# Patient Record
Sex: Female | Born: 1960 | Race: White | Hispanic: No | Marital: Married | Smoking: Never smoker
Health system: Southern US, Community
[De-identification: ages and names within clinical notes are randomized; demographics above are authoritative.]

## PROBLEM LIST (undated history)

## (undated) DIAGNOSIS — R0600 Dyspnea, unspecified: Secondary | ICD-10-CM

## (undated) DIAGNOSIS — M1990 Unspecified osteoarthritis, unspecified site: Secondary | ICD-10-CM

## (undated) DIAGNOSIS — R011 Cardiac murmur, unspecified: Secondary | ICD-10-CM

## (undated) DIAGNOSIS — K219 Gastro-esophageal reflux disease without esophagitis: Secondary | ICD-10-CM

## (undated) DIAGNOSIS — J45909 Unspecified asthma, uncomplicated: Secondary | ICD-10-CM

## (undated) HISTORY — PX: CERVICAL SPINE SURGERY: SHX589

## (undated) HISTORY — PX: ABDOMINAL HYSTERECTOMY: SHX81

---

## 1997-11-01 ENCOUNTER — Other Ambulatory Visit: Admission: RE | Admit: 1997-11-01 | Discharge: 1997-11-01 | Payer: Self-pay | Admitting: Gynecology

## 1998-04-19 ENCOUNTER — Ambulatory Visit (HOSPITAL_COMMUNITY): Admission: RE | Admit: 1998-04-19 | Discharge: 1998-04-19 | Payer: Self-pay | Admitting: Neurosurgery

## 1998-04-19 ENCOUNTER — Encounter: Payer: Self-pay | Admitting: Neurosurgery

## 1998-06-27 ENCOUNTER — Ambulatory Visit (HOSPITAL_COMMUNITY): Admission: RE | Admit: 1998-06-27 | Discharge: 1998-06-27 | Payer: Self-pay | Admitting: Gynecology

## 1998-06-27 ENCOUNTER — Encounter: Payer: Self-pay | Admitting: Gynecology

## 1998-07-03 ENCOUNTER — Encounter: Payer: Self-pay | Admitting: Gynecology

## 1998-07-03 ENCOUNTER — Ambulatory Visit (HOSPITAL_COMMUNITY): Admission: RE | Admit: 1998-07-03 | Discharge: 1998-07-03 | Payer: Self-pay | Admitting: Gynecology

## 1998-11-24 ENCOUNTER — Other Ambulatory Visit: Admission: RE | Admit: 1998-11-24 | Discharge: 1998-11-24 | Payer: Self-pay | Admitting: Gynecology

## 1999-09-03 ENCOUNTER — Encounter: Payer: Self-pay | Admitting: Gynecology

## 1999-09-03 ENCOUNTER — Ambulatory Visit (HOSPITAL_COMMUNITY): Admission: RE | Admit: 1999-09-03 | Discharge: 1999-09-03 | Payer: Self-pay | Admitting: Gynecology

## 1999-12-14 ENCOUNTER — Other Ambulatory Visit: Admission: RE | Admit: 1999-12-14 | Discharge: 1999-12-14 | Payer: Self-pay | Admitting: Gynecology

## 2000-09-03 ENCOUNTER — Ambulatory Visit (HOSPITAL_COMMUNITY): Admission: RE | Admit: 2000-09-03 | Discharge: 2000-09-03 | Payer: Self-pay | Admitting: Gynecology

## 2000-09-03 ENCOUNTER — Encounter: Payer: Self-pay | Admitting: Gynecology

## 2001-01-05 ENCOUNTER — Encounter: Payer: Self-pay | Admitting: *Deleted

## 2001-01-05 ENCOUNTER — Encounter: Admission: RE | Admit: 2001-01-05 | Discharge: 2001-01-05 | Payer: Self-pay | Admitting: *Deleted

## 2001-09-04 ENCOUNTER — Ambulatory Visit (HOSPITAL_COMMUNITY): Admission: RE | Admit: 2001-09-04 | Discharge: 2001-09-04 | Payer: Self-pay | Admitting: Gynecology

## 2001-09-04 ENCOUNTER — Encounter: Payer: Self-pay | Admitting: Gynecology

## 2002-08-19 ENCOUNTER — Encounter: Payer: Self-pay | Admitting: Neurological Surgery

## 2002-08-19 ENCOUNTER — Ambulatory Visit (HOSPITAL_COMMUNITY): Admission: RE | Admit: 2002-08-19 | Discharge: 2002-08-20 | Payer: Self-pay | Admitting: Neurological Surgery

## 2002-09-14 ENCOUNTER — Ambulatory Visit (HOSPITAL_COMMUNITY): Admission: RE | Admit: 2002-09-14 | Discharge: 2002-09-14 | Payer: Self-pay | Admitting: Gynecology

## 2002-09-14 ENCOUNTER — Encounter: Payer: Self-pay | Admitting: Gynecology

## 2002-09-29 ENCOUNTER — Other Ambulatory Visit: Admission: RE | Admit: 2002-09-29 | Discharge: 2002-09-29 | Payer: Self-pay | Admitting: Gynecology

## 2003-10-18 ENCOUNTER — Other Ambulatory Visit: Admission: RE | Admit: 2003-10-18 | Discharge: 2003-10-18 | Payer: Self-pay | Admitting: Gynecology

## 2003-10-24 ENCOUNTER — Ambulatory Visit (HOSPITAL_COMMUNITY): Admission: RE | Admit: 2003-10-24 | Discharge: 2003-10-24 | Payer: Self-pay | Admitting: Gynecology

## 2004-10-23 ENCOUNTER — Other Ambulatory Visit: Admission: RE | Admit: 2004-10-23 | Discharge: 2004-10-23 | Payer: Self-pay | Admitting: Gynecology

## 2004-10-25 ENCOUNTER — Ambulatory Visit (HOSPITAL_COMMUNITY): Admission: RE | Admit: 2004-10-25 | Discharge: 2004-10-25 | Payer: Self-pay | Admitting: Gynecology

## 2004-11-15 ENCOUNTER — Encounter: Admission: RE | Admit: 2004-11-15 | Discharge: 2004-11-15 | Payer: Self-pay | Admitting: Neurological Surgery

## 2005-11-05 ENCOUNTER — Ambulatory Visit (HOSPITAL_COMMUNITY): Admission: RE | Admit: 2005-11-05 | Discharge: 2005-11-05 | Payer: Self-pay | Admitting: Gynecology

## 2005-11-05 ENCOUNTER — Other Ambulatory Visit: Admission: RE | Admit: 2005-11-05 | Discharge: 2005-11-05 | Payer: Self-pay | Admitting: Gynecology

## 2006-11-10 ENCOUNTER — Ambulatory Visit (HOSPITAL_COMMUNITY): Admission: RE | Admit: 2006-11-10 | Discharge: 2006-11-10 | Payer: Self-pay | Admitting: Gynecology

## 2006-11-17 ENCOUNTER — Other Ambulatory Visit: Admission: RE | Admit: 2006-11-17 | Discharge: 2006-11-17 | Payer: Self-pay | Admitting: Gynecology

## 2007-04-30 ENCOUNTER — Ambulatory Visit (HOSPITAL_COMMUNITY): Admission: RE | Admit: 2007-04-30 | Discharge: 2007-04-30 | Payer: Self-pay | Admitting: Otolaryngology

## 2007-11-18 ENCOUNTER — Other Ambulatory Visit: Admission: RE | Admit: 2007-11-18 | Discharge: 2007-11-18 | Payer: Self-pay | Admitting: Gynecology

## 2007-11-19 ENCOUNTER — Ambulatory Visit (HOSPITAL_COMMUNITY): Admission: RE | Admit: 2007-11-19 | Discharge: 2007-11-19 | Payer: Self-pay | Admitting: Gynecology

## 2008-11-22 ENCOUNTER — Ambulatory Visit (HOSPITAL_COMMUNITY): Admission: RE | Admit: 2008-11-22 | Discharge: 2008-11-22 | Payer: Self-pay | Admitting: Gynecology

## 2009-11-27 ENCOUNTER — Ambulatory Visit (HOSPITAL_COMMUNITY): Admission: RE | Admit: 2009-11-27 | Discharge: 2009-11-27 | Payer: Self-pay | Admitting: Gynecology

## 2010-03-20 ENCOUNTER — Emergency Department (HOSPITAL_COMMUNITY)
Admission: EM | Admit: 2010-03-20 | Discharge: 2010-03-20 | Payer: Self-pay | Source: Home / Self Care | Admitting: Emergency Medicine

## 2010-03-21 LAB — DIFFERENTIAL
Basophils Absolute: 0 10*3/uL (ref 0.0–0.1)
Eosinophils Relative: 1 % (ref 0–5)
Lymphocytes Relative: 39 % (ref 12–46)
Lymphs Abs: 2.6 10*3/uL (ref 0.7–4.0)
Monocytes Absolute: 0.5 10*3/uL (ref 0.1–1.0)
Monocytes Relative: 8 % (ref 3–12)
Neutro Abs: 3.3 10*3/uL (ref 1.7–7.7)

## 2010-03-21 LAB — CBC
HCT: 37.6 % (ref 36.0–46.0)
Hemoglobin: 12.3 g/dL (ref 12.0–15.0)
MCH: 29.4 pg (ref 26.0–34.0)
MCHC: 32.7 g/dL (ref 30.0–36.0)
MCV: 90 fL (ref 78.0–100.0)
RDW: 12.8 % (ref 11.5–15.5)

## 2010-03-21 LAB — COMPREHENSIVE METABOLIC PANEL
AST: 25 U/L (ref 0–37)
CO2: 28 mEq/L (ref 19–32)
Calcium: 9.4 mg/dL (ref 8.4–10.5)
Chloride: 98 mEq/L (ref 96–112)
Creatinine, Ser: 0.73 mg/dL (ref 0.4–1.2)
GFR calc Af Amer: >60 mL/min (ref >60)
GFR calc non Af Amer: >60 mL/min (ref >60)
Glucose, Bld: 125 mg/dL — ABNORMAL HIGH (ref 70–99)
Total Bilirubin: 0.6 mg/dL (ref 0.3–1.2)

## 2010-03-21 LAB — URINALYSIS, ROUTINE W REFLEX MICROSCOPIC
Bilirubin Urine: NEGATIVE
Hgb urine dipstick: NEGATIVE
Ketones, ur: NEGATIVE mg/dL
Protein, ur: NEGATIVE mg/dL
Urine Glucose, Fasting: NEGATIVE mg/dL
Urobilinogen, UA: 0.2 mg/dL (ref 0.0–1.0)

## 2010-07-13 NOTE — Op Note (Signed)
[name], [name]                        ACCOUNT NO.:  [id number]   MEDICAL RECORD NO.:  [id number]                   PATIENT TYPE:  OIB   LOCATION:  2858                                 FACILITY:  [facility]   PHYSICIAN:  Stefani Dama, M.D.               DATE OF BIRTH:  09-03-1960   DATE OF PROCEDURE:  08/19/2002  DATE OF DISCHARGE:                                 OPERATIVE REPORT   PREOPERATIVE DIAGNOSIS:  Herniated nucleus pulposus and spondylosis C4-C5  and C5-C6 with right cervical radiculopathy.   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   PROCEDURE:  Anterior cervical decompression and arthrodesis and structural  allograft, C4-C5 and C5-C6, Synthes plate fixation.   SURGEON:  Stefani Dama, M.D.   FIRST ASSISTANT:  Coletta Memos, M.D.   ANESTHESIA:  General endotracheal anesthesia.   INDICATIONS FOR PROCEDURE:  The patient is a 50 year old individual who has  had significant and debilitating right cervical radiculopathy.  She has been  followed clinically for a number of years for repeated bouts of cervical  radiculopathy involving the triceps and biceps muscles in the past, however,  this past Monday she developed severe weakness in the right deltoid and  biceps and this was not responding to high dose corticosteroids.  She was  found to have severe spondylitic changes of C4-C5 and C5-C6 in addition to  other levels, however, the clinically significant ones were felt to be these  two.  She is taken to the operating room.   PROCEDURE:  The patient was brought to the operating room and placed on the  table in supine position.  After the smooth induction of general  endotracheal anesthesia, she was placed in 5 pounds of halter traction.  The  neck was prepped with DuraPrep and draped in a sterile fashion.  A  transverse incision was made in the mid portion of the neck and this was  carried down to the platysma.  A plane between the sternocleidomastoid  muscle and the strap muscles was dissected bluntly until the prevertebral  space was reached.  The first identifiable disc space was noted to be that  of C5-C6.  Self-retaining retractors were placed in the wound under the  longus colli muscle which was stripped down on either side.  There was a  large ventral osteophyte at the area of C5-C6 and attention was turned to C4-  C5 first where there was a somewhat smaller osteophytic ridge.  The 4-5 disc  space was opened with a #15 blade.  A combination of curets and rongeurs was  then used to evacuate the bulk of the disc material in the ventral aspect of  the disc space.  A self-retaining disc spreader was then placed in the wound  and then the lateral recesses were cleared of free disc material.  This was  noted to  be significantly degenerated.  The uncinate processes at this level  were rather prominent and these were dissected down and then drilled away  with a high speed air drill and 2.3 mm dissecting tool.  The dissection was  carried out in a subligamentous fashion.  A small osteophyte from the  inferior margin of the body of C4 was also drilled down.  The uncinate spurs  were drilled down and the lateral recesses were then cleared.  On the right  side, as the dissection proceeded, there was noted to be a fragment of disc  poking through the posterior longitudinal ligament and this created an  opening in the posterior longitudinal ligament which was then opened with a  2 mm and then a 3 mm Kerrison punch to expose the common dural tube and the  take off of the C5 nerve root on that right side.  This area was cautiously  decompressed.  There was significant bleeding from epidural veins which were  controlled with bipolar cautery and some small pledgets of Gelfoam which  were later irrigated away.  The left side was similarly decompressed.  Once  this was carried out,  hemostasis was achieved.  The interspace was sized for  a graft.  A 4 mm barrel bit was used to shake the interspace and smooth the  edges carefully.  A round 7 mm lordotic graft was then placed in the  interspace after the sizer identified this as the correct space.  The bone  graft was packed with autograft obtained from the shavings of the uncinate  spurs.  Once this was positioned correctly, a similar procedure was carried  out at C5-C6 where a large spur off the inferior margin of the body of C5  was identified.  This was drilled down with a 2.3 mm dissecting tool.  The  uncinate spurs were drilled down similarly.  No free fragments of disc were  encountered here and dissection was carried out in a similar fashion doing a  subligamentous dissection, then opening the ligament off to either side.  Once hemostasis was achieved, a 7 mm lordotic graft was again placed into  this space after smoothing the edges of the vertebral body with the 4 mm  barrel bit.  Traction was then removed.  A 37 mm standard size Synthes plate  was contoured to the prevertebral space after removing some of the ventral  osteophytes and smoothing this aspect completely.  Six locking 4 by 14 mm  screws were placed into the plate and secured.  Localizing radiographs  identified good position of the fixation then the area was copiously  irrigated with antibiotic irrigation solution.  Hemostasis in the soft  tissues was obtained.  3-0 Vicryl was used to close the platysma and 4-0  Vicryl was used to close the subcuticular skin.  Dermabond was used on the  skin.  The patient tolerated the procedure well.  Blood loss estimated 150  mL.                                               Stefani Dama, M.D.    Merla Riches  D:  08/19/2002  T:  08/20/2002  Job:  [id number]

## 2010-10-30 ENCOUNTER — Other Ambulatory Visit (HOSPITAL_COMMUNITY): Payer: Self-pay | Admitting: Gynecology

## 2010-10-30 DIAGNOSIS — Z1231 Encounter for screening mammogram for malignant neoplasm of breast: Secondary | ICD-10-CM

## 2010-11-29 ENCOUNTER — Ambulatory Visit (HOSPITAL_COMMUNITY)
Admission: RE | Admit: 2010-11-29 | Discharge: 2010-11-29 | Disposition: A | Discharge status: Home / Self Care | Payer: No Typology Code available for payment source | Source: Ambulatory Visit | Attending: Gynecology | Admitting: Gynecology

## 2010-11-29 DIAGNOSIS — Z1231 Encounter for screening mammogram for malignant neoplasm of breast: Secondary | ICD-10-CM | POA: Insufficient documentation

## 2011-11-05 ENCOUNTER — Other Ambulatory Visit (HOSPITAL_COMMUNITY): Payer: Self-pay | Admitting: Gynecology

## 2011-11-05 DIAGNOSIS — Z1231 Encounter for screening mammogram for malignant neoplasm of breast: Secondary | ICD-10-CM

## 2011-12-17 ENCOUNTER — Ambulatory Visit (HOSPITAL_COMMUNITY)
Admission: RE | Admit: 2011-12-17 | Discharge: 2011-12-17 | Disposition: A | Discharge status: Home / Self Care | Payer: No Typology Code available for payment source | Source: Ambulatory Visit | Attending: Gynecology | Admitting: Gynecology

## 2011-12-17 DIAGNOSIS — Z1231 Encounter for screening mammogram for malignant neoplasm of breast: Secondary | ICD-10-CM | POA: Insufficient documentation

## 2013-02-01 ENCOUNTER — Other Ambulatory Visit (HOSPITAL_COMMUNITY): Payer: Self-pay | Admitting: Gynecology

## 2013-02-01 DIAGNOSIS — Z1231 Encounter for screening mammogram for malignant neoplasm of breast: Secondary | ICD-10-CM

## 2013-03-01 ENCOUNTER — Ambulatory Visit (HOSPITAL_COMMUNITY)
Admission: RE | Admit: 2013-03-01 | Discharge: 2013-03-01 | Disposition: A | Discharge status: Home / Self Care | Payer: PRIVATE HEALTH INSURANCE | Source: Ambulatory Visit | Attending: Gynecology | Admitting: Gynecology

## 2013-03-01 DIAGNOSIS — Z1231 Encounter for screening mammogram for malignant neoplasm of breast: Secondary | ICD-10-CM | POA: Insufficient documentation

## 2013-08-16 ENCOUNTER — Ambulatory Visit (INDEPENDENT_AMBULATORY_CARE_PROVIDER_SITE_OTHER): Payer: PRIVATE HEALTH INSURANCE | Admitting: Cardiovascular Disease

## 2013-08-16 ENCOUNTER — Encounter: Payer: Self-pay | Admitting: Cardiovascular Disease

## 2013-08-16 VITALS — BP 126/87 | HR 65 | Ht 62.0 in | Wt 117.2 lb

## 2013-08-16 DIAGNOSIS — J45909 Unspecified asthma, uncomplicated: Secondary | ICD-10-CM

## 2013-08-16 DIAGNOSIS — E785 Hyperlipidemia, unspecified: Secondary | ICD-10-CM

## 2013-08-16 DIAGNOSIS — J4520 Mild intermittent asthma, uncomplicated: Secondary | ICD-10-CM

## 2013-08-16 DIAGNOSIS — R0609 Other forms of dyspnea: Secondary | ICD-10-CM

## 2013-08-16 DIAGNOSIS — K219 Gastro-esophageal reflux disease without esophagitis: Secondary | ICD-10-CM

## 2013-08-16 DIAGNOSIS — R0602 Shortness of breath: Secondary | ICD-10-CM

## 2013-08-16 DIAGNOSIS — R0989 Other specified symptoms and signs involving the circulatory and respiratory systems: Secondary | ICD-10-CM

## 2013-08-16 DIAGNOSIS — R0789 Other chest pain: Secondary | ICD-10-CM

## 2013-08-16 DIAGNOSIS — E782 Mixed hyperlipidemia: Secondary | ICD-10-CM

## 2013-08-16 MED ORDER — ROSUVASTATIN CALCIUM 10 MG PO TABS: 10.0000 mg | ORAL_TABLET | Freq: Every day | ORAL | Status: DC

## 2013-08-16 NOTE — Patient Instructions (Signed)
Your physician has requested that you have an echocardiogram. Echocardiography is a painless test that uses sound waves to create images of your heart. It provides your doctor with information about the size and shape of your heart and how well your heart's chambers and valves are working. This procedure takes approximately one hour. There are no restrictions for this procedure.  Your physician has requested that you have en exercise stress myoview. For further information please visit [email or web address]. Please follow instruction sheet, as given.   Your physician recommends that you return for lab work in: 6 weeks fasting.  Your physician has recommended you make the following change in your medication: take the aspirin 3 times weekly. Start new prescription for crestor.  This has already been sent to your pharmacy.  Your physician recommends that you schedule a follow-up appointment in: 2 months

## 2013-08-23 ENCOUNTER — Encounter: Payer: Self-pay | Admitting: Cardiovascular Disease

## 2013-08-23 DIAGNOSIS — E785 Hyperlipidemia, unspecified: Secondary | ICD-10-CM | POA: Insufficient documentation

## 2013-08-23 DIAGNOSIS — R0609 Other forms of dyspnea: Secondary | ICD-10-CM | POA: Insufficient documentation

## 2013-08-23 DIAGNOSIS — R0789 Other chest pain: Secondary | ICD-10-CM | POA: Insufficient documentation

## 2013-08-23 DIAGNOSIS — J45909 Unspecified asthma, uncomplicated: Secondary | ICD-10-CM | POA: Insufficient documentation

## 2013-08-23 DIAGNOSIS — K219 Gastro-esophageal reflux disease without esophagitis: Secondary | ICD-10-CM | POA: Insufficient documentation

## 2013-08-23 NOTE — Progress Notes (Signed)
Patient ID: CHAYAH MCKEE, female   DOB: [birth year], 53 y.o.   MRN: [id number]     PATIENT PROFILE: ALLEY NEILS is a 53 y.o. female his wife of Dr. [name] is followed by Dr. Kelton Pillar for primary care.  She presents to the office today with a chief complaint of progressive exertional dyspnea.   HPI:  STEFANNIE DEFEO is a 53 y.o. female  who denies any prior cardiac history.  She has remained fairly active and walks regularly with her husband.  Recently, she has noticed a definite change with development of more shortness of breath with activity.  She also has experienced a vague sensation of chest tightness, which can occur at night.  She does not believe this is due to asthma.  She also has noticed some occasional palpitations.  She recently saw Dr. Laurann Montana on 08/04/2013.  She now presents for cardiology evaluation.  History is also notable for esophageal reflux, intermittent, irritable bowel symptoms, any history of asthma.  History reviewed. No pertinent past medical history.   History reviewed. No pertinent past surgical history. Surgical history is notable for hysterectomy in 1995 and cervical discectomy in 2004.  Allergies  Allergen Reactions  . Sulfa Antibiotics Other (See Comments)    Dyspnea and chest tightness  . Percocet [Oxycodone-Acetaminophen] Hives and Itching    Current Outpatient Prescriptions  Medication Sig Dispense Refill  . albuterol (PROVENTIL HFA;VENTOLIN HFA) 108 (90 BASE) MCG/ACT inhaler Inhale 1-2 puffs into the lungs every 6 (six) hours as needed for wheezing or shortness of breath.      Marland Kitchen aspirin 81 MG tablet Take 81 mg by mouth 2 (two) times a week. Tuesdays and Fridays.      . Calcium Carbonate-Vitamin D (CALCIUM-VITAMIN D3 PO) Take 1 tablet by mouth daily.      . Cholecalciferol (VITAMIN D) 2000 UNITS CAPS Take 1 capsule by mouth daily.      . Estradiol (VAGIFEM) 10 MCG TABS vaginal tablet Place 1 tablet vaginally 2 (two) times a week.       . lansoprazole (PREVACID) 30 MG capsule Take 30 mg by mouth daily.      . magnesium oxide (MAG-OX) 400 MG tablet Take 400 mg by mouth daily.      . naproxen sodium (ANAPROX) 220 MG tablet Take 1 tablet by mouth every morning, take 2 tablets by mouth every evening.      . Omega-3 350 MG CAPS Take 1 capsule by mouth daily.      Earney Navy Bicarbonate (ZEGERID) 20-1100 MG CAPS capsule Take 1 capsule by mouth at bedtime.      Vladimir Faster Glycol-Propyl Glycol (SYSTANE ULTRA OP) Place 1 drop into both eyes daily as needed.      . polyethylene glycol (MIRALAX / GLYCOLAX) packet Take 17 g by mouth daily as needed.      . ranitidine (ZANTAC) 150 MG tablet Take 150 mg by mouth at bedtime.      . rosuvastatin (CRESTOR) 10 MG tablet Take 1 tablet (10 mg total) by mouth daily.  30 tablet  6   No current facility-administered medications for this visit.   Socially, she is married to Dr. [name] for 34 years.  She has 2 children who are both lowers and now live in [place].  His she previously was as a Management consultant.  There is no tobacco history.  She rarely drinks alcohol.  She does exercise and performs one to 3 times per  week.  She does walk with her husband.  Family History  Problem Relation Age of Onset  . Cancer Mother   . Hypertension Father   . Diabetes Maternal Grandmother   . Diabetes Paternal Grandfather     Pancreatic cancer    ROS General: Negative; No fevers, chills, or night sweats HEENT: Negative; No changes in vision or hearing, sinus congestion, difficulty swallowing Pulmonary: Positive for asthma; No cough, wheezing,hemoptysis Cardiovascular:  See HPI; positive for exertional shortness of breath and vague episodes of chest tightness;  presyncope, syncope, edema GI: Positive for esophageal reflux;  No nausea, vomiting, diarrhea, or abdominal pain GU: Positive for hysterectomy No dysuria, hematuria, or difficulty voiding Musculoskeletal: Negative; no  myalgias, joint pain, or weakness Hematologic/Oncologic: Negative; no easy bruising, bleeding Endocrine: Negative; no heat/cold intolerance; no diabetes Neuro: Negative; no changes in balance, headaches Skin: Negative; No rashes or skin lesions Psychiatric: Negative; No behavioral problems, depression Sleep: Negative; No daytime sleepiness, hypersomnolence, bruxism, restless legs, hypnogagnic hallucinations Other comprehensive 14 point system review is negative   Physical Exam BP 126/87  Pulse 65  Ht '5\' 2"'  (1.575 m)  Wt 117 lb 3.2 oz (53.162 kg)  BMI 21.43 kg/m2 General: Alert, oriented, no distress.  Skin: normal turgor, no rashes, warm and dry HEENT: Normocephalic, atraumatic. Pupils equal round and reactive to light; sclera anicteric; extraocular muscles intact; Fundi no hemorrhages or exudates, no evidence for AV nicking.  Discs flat. Nose without nasal septal hypertrophy Mouth/Parynx benign; Mallinpatti scale 2 Neck: No JVD, no carotid bruits; normal carotid upstroke Lungs: clear to ausculatation and percussion; no wheezing or rales Chest wall: without tenderness to palpitation Heart: PMI not displaced, RRR, s1 s2 normal, 1/6 systolic murmur, no diastolic murmur, no rubs, gallops, thrills, or heaves Abdomen: soft, nontender; no hepatosplenomehaly, BS+; abdominal aorta nontender and not dilated by palpation. Back: no CVA tenderness Pulses 2+ Musculoskeletal: full range of motion, normal strength, no joint deformities Extremities: no clubbing cyanosis or edema, Homan's sign negative  Neurologic: grossly nonfocal; Cranial nerves grossly wnl Psychologic: Normal mood and affect   ECG (independently read by me): Normal sinus rhythm at 65 beats per minute.  PR interval normal at 124 ms.  QT 0.49 ms.  LABS:  BMET    Component Value Date/Time   NA 135 03/20/2010 1858   K 3.6 03/20/2010 1858   CL 98 03/20/2010 1858   CO2 28 03/20/2010 1858   GLUCOSE 125* 03/20/2010 1858   BUN 10  03/20/2010 1858   CREATININE 0.73 03/20/2010 1858   CALCIUM 9.4 03/20/2010 1858   GFRNONAA >60 03/20/2010 1858   GFRAA  Value: >60        The eGFR has been calculated using the MDRD equation. This calculation has not been validated in all clinical situations. eGFR's persistently <60 mL/min signify possible Chronic Kidney Disease. 03/20/2010 1858     Hepatic Function Panel     Component Value Date/Time   PROT 7.7 03/20/2010 1858   ALBUMIN 4.0 03/20/2010 1858   AST 25 03/20/2010 1858   ALT 20 03/20/2010 1858   ALKPHOS 58 03/20/2010 1858   BILITOT 0.6 03/20/2010 1858     CBC    Component Value Date/Time   WBC 6.6 03/20/2010 1858   RBC 4.18 03/20/2010 1858   HGB 12.3 03/20/2010 1858   HCT 37.6 03/20/2010 1858   PLT 241 03/20/2010 1858   MCV 90.0 03/20/2010 1858   MCH 29.4 03/20/2010 1858   MCHC 32.7 03/20/2010 1858  RDW 12.8 03/20/2010 1858   LYMPHSABS 2.6 03/20/2010 1858   MONOABS 0.5 03/20/2010 1858   EOSABS 0.1 03/20/2010 1858   BASOSABS 0.0 03/20/2010 1858     BNP No results found for this basename: probnp    Lipid Panel  No results found for this basename: chol, trig, hdl, cholhdl, vldl, ldlcalc      RADIOLOGY: No results found.   ASSESSMENT AND PLAN:  Ms  Aritha Huckeba is a very pleasant 53 year old female, who has been active for most her life.  She does have a history of GERD for which he takes Zegerid as well as Prevacid.  There is also a history of asthma, for which he takes Proventil on an as-needed basis.  She has been active, but recently has noticed a change in that she clearly has developed more shortness of breath, particularly with walking fast.  She also has noticed some episodes of chest tightness and occasional palpitations.  She does have seasonal allergies.  I also reviewed laboratory by Dr. Laurann Montana.  Her cholesterol is elevated at 255 with an LDL cholesterol of 166.  Triglycerides were normal at 52 as was her HDL cholesterol is 79.  Since 2013, there's been a  gradual trend upward in her total cholesterol, increasing from 212 to 229 and now up 255. Thyroid function studies were normal at 192.  I am recommending institution of Crestor at 10 mg particularly with her cholesterol elevation, and recent symptoms.  Her major complaint is that of more exertional shortness of breath.  Since we no longer have cardiopulmonary met testing available in our office I am recommending that she undergo an exercise Myoview study rather than a routine treadmill test for more improved sensitivity and specificity of this exertional dyspnea.  I'm scheduling her for an echo Doppler study to further evaluate her systolic and diastolic function.  In 6 weeks repeat blood work with a comprehensive metabolic panel, NMR lipoprofile, and CBC level will be obtained.  I will see her in 2 months for cardiology followup evaluation.   Troy Sine, MD, [facility] 08/23/2013 7:53 PM

## 2013-08-26 ENCOUNTER — Telehealth (HOSPITAL_COMMUNITY): Payer: Self-pay

## 2013-08-26 NOTE — Telephone Encounter (Signed)
Awaiting return call

## 2013-09-01 ENCOUNTER — Ambulatory Visit (HOSPITAL_COMMUNITY)
Admission: RE | Admit: 2013-09-01 | Discharge: 2013-09-01 | Disposition: A | Discharge status: Home / Self Care | Payer: PRIVATE HEALTH INSURANCE | Source: Ambulatory Visit | Attending: Cardiology | Admitting: Cardiology

## 2013-09-01 DIAGNOSIS — R0989 Other specified symptoms and signs involving the circulatory and respiratory systems: Secondary | ICD-10-CM | POA: Insufficient documentation

## 2013-09-01 DIAGNOSIS — R0602 Shortness of breath: Secondary | ICD-10-CM

## 2013-09-01 DIAGNOSIS — J45909 Unspecified asthma, uncomplicated: Secondary | ICD-10-CM | POA: Insufficient documentation

## 2013-09-01 DIAGNOSIS — Z8249 Family history of ischemic heart disease and other diseases of the circulatory system: Secondary | ICD-10-CM | POA: Insufficient documentation

## 2013-09-01 DIAGNOSIS — R0609 Other forms of dyspnea: Secondary | ICD-10-CM | POA: Insufficient documentation

## 2013-09-01 DIAGNOSIS — R002 Palpitations: Secondary | ICD-10-CM | POA: Insufficient documentation

## 2013-09-01 DIAGNOSIS — R079 Chest pain, unspecified: Secondary | ICD-10-CM | POA: Insufficient documentation

## 2013-09-01 DIAGNOSIS — I369 Nonrheumatic tricuspid valve disorder, unspecified: Secondary | ICD-10-CM

## 2013-09-01 MED ORDER — TECHNETIUM TC 99M SESTAMIBI GENERIC - CARDIOLITE
10.0000 | Freq: Once | INTRAVENOUS | Status: AC | PRN
  Administered 2013-09-01: 10 via INTRAVENOUS

## 2013-09-01 MED ORDER — TECHNETIUM TC 99M SESTAMIBI GENERIC - CARDIOLITE
30.0000 | Freq: Once | INTRAVENOUS | Status: AC | PRN
  Administered 2013-09-01: 30 via INTRAVENOUS

## 2013-09-01 NOTE — Procedures (Addendum)
[facility] [address] [place] [address] [phone]  Cardiology Nuclear Med Study  [name] is a 53 y.o. female     MRN : [id number]     DOB: December 31, 1960  Procedure Date: 09/01/2013  Nuclear Med Background Indication for Stress Test:  Evaluation for Ischemia History:  Asthma and No prior cardiac history reported;No prior NUC MPI for comparison. Cardiac Risk Factors: Family History - CAD and Lipids  Symptoms:  Chest Pain, DOE and Palpitations   Nuclear Pre-Procedure Caffeine/Decaff Intake:  8:00pm NPO After: 6:00am   IV Site: R Forearm  IV 0.9% NS with Angio Cath:  22g  Chest Size (in):  n/a IV Started by: Berdie OgrenAmanda Wease, RN  Height: 5\' 2"  (1.575 m)  Cup Size: C  BMI:  Body mass index is 21.39 kg/(m^2). Weight:  117 lb (53.071 kg)   Tech Comments:  n/a    Nuclear Med Study 1 or 2 day study: 1 day  Stress Test Type:  Stress  Order Authorizing Provider:  Nicki Guadalajarahomas Kelly, MD   Resting Radionuclide: Technetium 4964m Sestamibi  Resting Radionuclide Dose: 10.4 mCi   Stress Radionuclide:  Technetium 3864m Sestamibi  Stress Radionuclide Dose: 30.6 mCi           Stress Protocol Rest HR: 68 Stress HR: 160  Rest BP: 124/74 Stress BP: 146/81  Exercise Time (min): 10:21 METS: 12.20   Predicted Max HR: 165 bpm % Max HR: 96.97 bpm Rate Pressure Product: [id number]  Dose of Adenosine (mg):  n/a Dose of Lexiscan: n/a mg  Dose of Atropine (mg): n/a Dose of Dobutamine: n/a mcg/kg/min (at max HR)  Stress Test Technologist: Ernestene MentionGwen Farrington, CCT Nuclear Technologist: Koren Shiverobin Moffitt, CNMT   Rest Procedure:  Myocardial perfusion imaging was performed at rest 45 minutes following the intravenous administration of Technetium 5364m Sestamibi. Stress Procedure:  The patient performed treadmill exercise using a Bruce  Protocol for 10 minutes 21 seconds. The patient stopped due to generalized fatigue. Patient denied any chest pain.  There were  no significant ST-T wave changes.  Technetium 1564m Sestamibi was injected IV at peak exercise and myocardial perfusion imaging was performed after a brief delay.  Transient Ischemic Dilatation (Normal <1.22):  0.51 QGS EDV:  49 ml QGS ESV:  17 ml LV Ejection Fraction: 66%  PHYSICIAN INTERPREATION  Rest ECG: NSR with non-specific ST-T wave changes  Stress ECG: No significant change from baseline ECG, No significant ST segment change suggestive of ischemia. and Insignificant upsloping ST segment depression.  QPS Raw Data Images:  Rest image acquisition was grossly inadequate for accurate interpretation of reversibility.  Resting images are partially obscured by splanchnic tracer uptake leading to poor LV estimation.  Stress Images:  There is decreased uptake in the apex.  There is decreased uptake in the anterior wall.  Focal Small area of moderate intensity perfusion defect in the anterior apical wall. Rest Images:  Poor LV Capture & alignment leads to inaccurate data acquistion.  Visually there does appear to be a similar anteroapical defect with partial reversibiltiy, however the intensity of the defect cannot be accurately estimated. Subtraction (SDS):  There appears to be a small area of anteroapical perfusion defect that cannot be accurately described.  Cannot exclude possible mild ischemia.  The lack of wall motion abnormality or decreased thickening argues against a prior infarction. With the location, cannot exclude breast attenuation. LV Wall Motion:  NL LV Function; NL Wall Motion  Impression Exercise Capacity:  Excellent exercise capacity. BP Response:  Hypertensive blood pressure response. Clinical Symptoms:  There is dyspnea. No Angina  ECG Impression:  Insignificant upsloping ST segment depression. Duke TM Score of 10 (reaching Stage IV) - LOW RISK TM Score. Comparison with Prior Nuclear Study: No images to compare  Overall Impression:  Indeterminate Study due to poor Resting  Image data aquisition.  Clinically, with a Duke TM Score of 10, notably in the absence of Angina, anteroapical ischmia is unlikely.  Clinical correlation is warranted.    Marykay LexHARDING,DAVID W, MD  09/02/2013 9:45 AM

## 2013-09-01 NOTE — Progress Notes (Signed)
2D Echo Performed 09/01/2013    Fontaine Hehl, RCS  

## 2013-09-03 ENCOUNTER — Encounter: Payer: Self-pay | Admitting: *Deleted

## 2013-10-04 LAB — CBC
HEMATOCRIT: 36.1 % (ref 36.0–46.0)
Hemoglobin: 11.9 g/dL — ABNORMAL LOW (ref 12.0–15.0)
MCH: 30.1 pg (ref 26.0–34.0)
MCHC: 32.9 g/dL (ref 30.0–36.0)
MCV: 91.4 fL (ref 78.0–100.0)
PLATELETS: 254 10*3/uL (ref 150–400)
RBC: 3.95 MIL/uL (ref 3.87–5.11)
RDW: 13.4 % (ref 11.5–15.5)
WBC: 6.6 10*3/uL (ref 4.0–10.5)

## 2013-10-04 LAB — COMPREHENSIVE METABOLIC PANEL
ALT: 18 U/L (ref 0–35)
AST: 15 U/L (ref 0–37)
Albumin: 3.8 g/dL (ref 3.5–5.2)
Alkaline Phosphatase: 39 U/L (ref 39–117)
BILIRUBIN TOTAL: 0.4 mg/dL (ref 0.2–1.2)
BUN: 21 mg/dL (ref 6–23)
CO2: 28 meq/L (ref 19–32)
CREATININE: 0.7 mg/dL (ref 0.50–1.10)
Calcium: 9.6 mg/dL (ref 8.4–10.5)
Chloride: 103 mEq/L (ref 96–112)
GLUCOSE: 107 mg/dL — AB (ref 70–99)
Potassium: 5.1 mEq/L (ref 3.5–5.3)
Sodium: 143 mEq/L (ref 135–145)
Total Protein: 7.3 g/dL (ref 6.0–8.3)

## 2013-10-07 ENCOUNTER — Ambulatory Visit (INDEPENDENT_AMBULATORY_CARE_PROVIDER_SITE_OTHER): Payer: PRIVATE HEALTH INSURANCE | Admitting: Cardiovascular Disease

## 2013-10-07 VITALS — BP 119/79 | HR 63 | Ht 62.0 in | Wt 117.8 lb

## 2013-10-07 DIAGNOSIS — E785 Hyperlipidemia, unspecified: Secondary | ICD-10-CM

## 2013-10-07 DIAGNOSIS — R0609 Other forms of dyspnea: Secondary | ICD-10-CM

## 2013-10-07 DIAGNOSIS — J4520 Mild intermittent asthma, uncomplicated: Secondary | ICD-10-CM

## 2013-10-07 DIAGNOSIS — R0989 Other specified symptoms and signs involving the circulatory and respiratory systems: Secondary | ICD-10-CM

## 2013-10-07 DIAGNOSIS — K219 Gastro-esophageal reflux disease without esophagitis: Secondary | ICD-10-CM

## 2013-10-07 DIAGNOSIS — J45909 Unspecified asthma, uncomplicated: Secondary | ICD-10-CM

## 2013-10-07 NOTE — Patient Instructions (Signed)
Your physician recommends that you schedule a follow-up appointment in: 6 months. No changes were made today in your therapy. 

## 2013-10-08 ENCOUNTER — Other Ambulatory Visit: Payer: Self-pay | Admitting: Cardiovascular Disease

## 2013-10-09 ENCOUNTER — Encounter: Payer: Self-pay | Admitting: Cardiovascular Disease

## 2013-10-09 NOTE — Progress Notes (Signed)
Patient ID: [name], female   DOB: June 03, 1960, 53 y.o.   MRN: [id number]     HPI:  [name] is a 53 y.o. female  who I had seen for initial cardiology evaluation 2 months ago with complaints of exertional dyspnea.  She is the wife of Dr. [name] , and sees Dr. Kelton Pillar, for primary care.     [name] denies any prior cardiac history.  She has remained fairly active and walks regularly with her husband.  Recently, she noticed a definite change with development of more shortness of breath with activity.  She also has experienced a vague sensation of chest tightness, which can occur at night.  She does not believe this is due to asthma.  She also has noticed some occasional palpitations.  She saw Dr. Laurann Montana on 08/04/2013 and saw me for cardiology evaluation.  History is also notable for esophageal reflux, intermittent, irritable bowel symptoms, any history of asthma.  She underwent a 2-D echo Doppler study on 09/01/2013.  This showed an ejection fraction of 55-60%.  She had normal wall motion without regional wall motion abnormalities.  Doppler parameters are consistent with mild grade 1 diastolic dysfunction.  There was trivial aortic insufficiency.  She underwent a nuclear perfusion study and exercise for 10 minutes and 21 seconds.  She stopped secondary to fatigue.  There were no diagnostic ST changes.  It was reported that she had a mild hypertensive response to exercise.  There was a mild defect  in the anteroapical region and it was felt most likely that this was breast attenuation artifact.  However, a minimal region of ischemia could not be completely excluded.  She had normal contractility with an ejection fraction of 66%.  When I saw her.  I also recommended initiation of statin therapy in light of her lipid panel done by Dr. Coral Spikes in June, which showed a total cholesterol 255, triglycerides 52, HDL 79, but an LDL of 166.  She has been taking Crestor 10 mg.  She  did have subsequent blood work, which showed normal LFTs, and chemistry.  Glucose was minimally increased at 107.  Hemoglobin 11.9, hematocrit 36.1.  She was supposed to have an NMR lipoprotein L. but apparently the lab did not draw this blood.  She will have this done.  She has felt well.  She recently went to [place] and did note some mild shortness of breath with steep uphill walking on the volcanoes; otherwise, she denies any significant symptomatology.  She exercises regularly.  History reviewed. No pertinent past medical history.   History reviewed. No pertinent past surgical history. Surgical history is notable for hysterectomy in 1995 and cervical discectomy in 2004.  Allergies  Allergen Reactions  . Sulfa Antibiotics Other (See Comments)    Dyspnea and chest tightness  . Percocet [Oxycodone-Acetaminophen] Hives and Itching    Current Outpatient Prescriptions  Medication Sig Dispense Refill  . albuterol (PROVENTIL HFA;VENTOLIN HFA) 108 (90 BASE) MCG/ACT inhaler Inhale 1-2 puffs into the lungs every 6 (six) hours as needed for wheezing or shortness of breath.      Marland Kitchen aspirin 81 MG tablet Take 81 mg by mouth 3 (three) times a week.       . Calcium Carbonate-Vitamin D (CALCIUM-VITAMIN D3 PO) Take 1 tablet by mouth daily.      . Cholecalciferol (VITAMIN D) 2000 UNITS CAPS Take 1 capsule by mouth daily.      . Estradiol (VAGIFEM) 10 MCG TABS vaginal  tablet Place 1 tablet vaginally 2 (two) times a week.      . lansoprazole (PREVACID) 30 MG capsule Take 30 mg by mouth daily.      . magnesium oxide (MAG-OX) 400 MG tablet Take 400 mg by mouth daily.      . naproxen sodium (ANAPROX) 220 MG tablet Take 1 tablet by mouth every morning, take 2 tablets by mouth every evening.      . Omega-3 350 MG CAPS Take 1 capsule by mouth daily.      Earney Navy Bicarbonate (ZEGERID) 20-1100 MG CAPS capsule Take 1 capsule by mouth at bedtime.      Vladimir Faster Glycol-Propyl Glycol (SYSTANE ULTRA OP)  Place 1 drop into both eyes daily as needed.      . polyethylene glycol (MIRALAX / GLYCOLAX) packet Take 17 g by mouth daily as needed.      . ranitidine (ZANTAC) 150 MG tablet Take 150 mg by mouth at bedtime.      . rosuvastatin (CRESTOR) 10 MG tablet Take 1 tablet (10 mg total) by mouth daily.  30 tablet  6   No current facility-administered medications for this visit.   Socially, she is married to Dr. [name] for 34 years.  She has 2 children who are both lowers and now live in [place].  His she previously was as a Management consultant.  There is no tobacco history.  She rarely drinks alcohol.  She does exercise and performs one to 3 times per week.  She does walk with her husband.  Family History  Problem Relation Age of Onset  . Cancer Mother   . Hypertension Father   . Diabetes Maternal Grandmother   . Diabetes Paternal Grandfather     Pancreatic cancer    ROS General: Negative; No fevers, chills, or night sweats HEENT: Negative; No changes in vision or hearing, sinus congestion, difficulty swallowing Pulmonary: Positive for asthma; No cough, wheezing,hemoptysis Cardiovascular:  See HPI; positive for exertional shortness of breath and vague episodes of chest tightness;  presyncope, syncope, edema GI: Positive for esophageal reflux;  No nausea, vomiting, diarrhea, or abdominal pain GU: Positive for hysterectomy No dysuria, hematuria, or difficulty voiding Musculoskeletal: Negative; no myalgias, joint pain, or weakness Hematologic/Oncologic: Negative; no easy bruising, bleeding Endocrine: Negative; no heat/cold intolerance; no diabetes Neuro: Negative; no changes in balance, headaches Skin: Negative; No rashes or skin lesions Psychiatric: Negative; No behavioral problems, depression Sleep: Negative; No daytime sleepiness, hypersomnolence, bruxism, restless legs, hypnogagnic hallucinations Other comprehensive 14 point system review is negative   Physical Exam BP 119/79   Pulse 63  Ht '5\' 2"'  (1.575 m)  Wt 117 lb 12.8 oz (53.434 kg)  BMI 21.54 kg/m2 General: Alert, oriented, no distress.  Skin: normal turgor, no rashes, warm and dry HEENT: Normocephalic, atraumatic. Pupils equal round and reactive to light; sclera anicteric; extraocular muscles intact; Fundi no hemorrhages or exudates, no evidence for AV nicking.  Discs flat. Nose without nasal septal hypertrophy Mouth/Parynx benign; Mallinpatti scale 2 Neck: No JVD, no carotid bruits; normal carotid upstroke Lungs: clear to ausculatation and percussion; no wheezing or rales Chest wall: without tenderness to palpitation Heart: PMI not displaced, RRR, s1 s2 normal, 1/6 systolic murmur, no diastolic murmur, no rubs, gallops, thrills, or heaves Abdomen: soft, nontender; no hepatosplenomehaly, BS+; abdominal aorta nontender and not dilated by palpation. Back: no CVA tenderness Pulses 2+ Musculoskeletal: full range of motion, normal strength, no joint deformities Extremities: no clubbing cyanosis or edema, Homan's  sign negative  Neurologic: grossly nonfocal; Cranial nerves grossly wnl Psychologic: Normal mood and affect   08/16/13 ECG (independently read by me): Normal sinus rhythm at 65 beats per minute.  PR interval normal at 124 Marcia.  QT 0.49 Marcia.  LABS:  BMET    Component Value Date/Time   NA 143 10/04/2013 0940   K 5.1 10/04/2013 0940   CL 103 10/04/2013 0940   CO2 28 10/04/2013 0940   GLUCOSE 107* 10/04/2013 0940   BUN 21 10/04/2013 0940   CREATININE 0.70 10/04/2013 0940   CREATININE 0.73 03/20/2010 1858   CALCIUM 9.6 10/04/2013 0940   GFRNONAA >60 03/20/2010 1858   GFRAA  Value: >60        The eGFR has been calculated using the MDRD equation. This calculation has not been validated in all clinical situations. eGFR's persistently <60 mL/min signify possible Chronic Kidney Disease. 03/20/2010 1858     Hepatic Function Panel     Component Value Date/Time   PROT 7.3 10/04/2013 0940   ALBUMIN 3.8  10/04/2013 0940   AST 15 10/04/2013 0940   ALT 18 10/04/2013 0940   ALKPHOS 39 10/04/2013 0940   BILITOT 0.4 10/04/2013 0940     CBC    Component Value Date/Time   WBC 6.6 10/04/2013 0940   RBC 3.95 10/04/2013 0940   HGB 11.9* 10/04/2013 0940   HCT 36.1 10/04/2013 0940   PLT 254 10/04/2013 0940   MCV 91.4 10/04/2013 0940   MCH 30.1 10/04/2013 0940   MCHC 32.9 10/04/2013 0940   RDW 13.4 10/04/2013 0940   LYMPHSABS 2.6 03/20/2010 1858   MONOABS 0.5 03/20/2010 1858   EOSABS 0.1 03/20/2010 1858   BASOSABS 0.0 03/20/2010 1858     BNP No results found for this basename: probnp    Lipid Panel  No results found for this basename: chol,  trig,  hdl,  cholhdl,  vldl,  ldlcalc      RADIOLOGY: No results found.   ASSESSMENT AND PLAN:  Marcia  [name] is a very pleasant 53 year old female, who has been active for most her life.  She does have a history of GERD for which he takes Zegerid as well as Prevacid.  There is also a history of asthma, for which he takes Proventil on an as-needed basis.  She has noticed exertional shortness of breath, particularly with steep hills.  I reviewed her echo Doppler data, as well as a nuclear perfusion study and also reviewed the images personally of her Myoview.  She does have firm breast tissue and I suspect the apical anterolateral defect is most likely due to breast induced artifact.  She had normal contractility and normal systolic thickening of all myocardial segments.  Her ejection fraction is normal.  However, she was found to have mild diastolic dysfunction.  I discussed with her the potential for diastolic dysfunction to induce exertional dyspnea.  She feels well presently.  She denies any episodes of chest pain.  We also discussed the potential for endothelial dysfunction, particularly if she does have significant hyperlipidemia , potentially contributing to her abnormality.  Again, discussed cardiopulmonary neck testing as a way to assess potential small  vessel disease/endothelial dysfunctioncontribution.   She will undergo an NMR LipoProfile on Crestor to assess particle number.  I will contact her regarding the results.  I will see her in 6 months for followup evaluation.  Troy Sine, MD, [facility] 10/09/2013 7:31 AM

## 2013-10-10 LAB — NMR LIPOPROFILE WITH LIPIDS
CHOLESTEROL, TOTAL: 169 mg/dL (ref <200)
HDL Particle Number: 34.3 umol/L (ref >=30.5)
HDL Size: 10.5 nm (ref >=9.2)
HDL-C: 83 mg/dL (ref >=40)
LDL CALC: 75 mg/dL (ref <100)
LDL Particle Number: 857 nmol/L (ref <1000)
LDL Size: 21.3 nm (ref >20.5)
Large HDL-P: 18.5 umol/L (ref >=4.8)
Small LDL Particle Number: 96 nmol/L (ref <=527)
Triglycerides: 53 mg/dL (ref <150)

## 2013-10-14 ENCOUNTER — Telehealth: Payer: Self-pay | Admitting: *Deleted

## 2013-10-14 ENCOUNTER — Encounter: Payer: Self-pay | Admitting: *Deleted

## 2013-10-14 NOTE — Telephone Encounter (Signed)
Message copied by Gaynelle CageWADDELL, WANDA M. on Thu Oct 14, 2013 12:44 PM ------      Message from: Nicki GuadalajaraKELLY, THOMAS A      Created: Tue Oct 12, 2013  9:47 AM       Excellent NMR; lipids much improved with low dose crestor ------

## 2013-10-14 NOTE — Telephone Encounter (Signed)
called and informed patient of lab results. Patient requested for a copy to be mailed to her.

## 2013-10-15 ENCOUNTER — Telehealth: Payer: Self-pay | Admitting: Cardiovascular Disease

## 2013-10-15 NOTE — Telephone Encounter (Signed)
Closed encounter °

## 2014-01-03 ENCOUNTER — Telehealth: Payer: Self-pay | Admitting: Cardiovascular Disease

## 2014-01-03 NOTE — Telephone Encounter (Signed)
Closed encounter °

## 2014-02-05 ENCOUNTER — Other Ambulatory Visit: Payer: Self-pay | Admitting: Cardiovascular Disease

## 2014-02-07 NOTE — Telephone Encounter (Signed)
Rx was sent to pharmacy electronically. 

## 2014-02-11 ENCOUNTER — Other Ambulatory Visit (HOSPITAL_COMMUNITY): Payer: Self-pay | Admitting: Obstetrics and Gynecology

## 2014-02-11 DIAGNOSIS — Z1231 Encounter for screening mammogram for malignant neoplasm of breast: Secondary | ICD-10-CM

## 2014-03-02 ENCOUNTER — Other Ambulatory Visit (HOSPITAL_COMMUNITY): Payer: Self-pay | Admitting: Obstetrics and Gynecology

## 2014-03-02 ENCOUNTER — Ambulatory Visit (HOSPITAL_COMMUNITY)
Admission: RE | Admit: 2014-03-02 | Discharge: 2014-03-02 | Disposition: A | Discharge status: Home / Self Care | Payer: PRIVATE HEALTH INSURANCE | Source: Ambulatory Visit | Attending: Obstetrics and Gynecology | Admitting: Obstetrics and Gynecology

## 2014-03-02 DIAGNOSIS — Z1231 Encounter for screening mammogram for malignant neoplasm of breast: Secondary | ICD-10-CM

## 2014-04-13 ENCOUNTER — Ambulatory Visit (INDEPENDENT_AMBULATORY_CARE_PROVIDER_SITE_OTHER): Payer: PRIVATE HEALTH INSURANCE | Admitting: Cardiovascular Disease

## 2014-04-13 ENCOUNTER — Encounter: Payer: Self-pay | Admitting: Cardiovascular Disease

## 2014-04-13 VITALS — BP 114/78 | HR 69 | Ht 62.0 in | Wt 117.8 lb

## 2014-04-13 DIAGNOSIS — K219 Gastro-esophageal reflux disease without esophagitis: Secondary | ICD-10-CM

## 2014-04-13 DIAGNOSIS — E785 Hyperlipidemia, unspecified: Secondary | ICD-10-CM

## 2014-04-13 NOTE — Patient Instructions (Signed)
Your physician wants you to follow-up office visit and labs in August. You will receive a reminder letter in the mail two months in advance. If you don't receive a letter, please call our office to schedule the follow-up appointment.

## 2014-04-14 ENCOUNTER — Encounter: Payer: Self-pay | Admitting: Cardiovascular Disease

## 2014-04-14 NOTE — Progress Notes (Signed)
Patient ID: [name], female   DOB: 08/01/1960, 54 y.o.   MRN: [id number]    Primary MD: Dr. Kelton Pillar  HPI:  [name] is a 54 y.o. female  who presents for a six-month follow-up cardiology evaluation.  She is the wife of Dr. Jovita Mathews.   [name] denies any prior cardiac history.  She has remained fairly active and walks regularly with her husband.  Last summer she noticed a definite change with development of more shortness of breath with activity and experienced a vague sensation of chest tightness which can occur at night.  She does not believe this is due to asthma.  She also has noticed some occasional palpitations.  She saw Dr. Laurann Montana on 08/04/2013 and saw me for initial cardiology evaluation shortly thereafter.   A 2-D echo Doppler study on 09/01/2013  showed an ejection fraction of 55-60%.  She had normal wall motion without regional wall motion abnormalities.  Doppler parameters are consistent with mild grade 1 diastolic dysfunction.  There was trivial aortic insufficiency.  She underwent a nuclear perfusion study and exercise for 10 minutes and 21 seconds.  She stopped secondary to fatigue.  There were no diagnostic ST changes.  It was reported that she had a mild hypertensive response to exercise.  There was a mild defect  in the anteroapical region and it was felt most likely that this was breast attenuation artifact.  However, a minimal region of ischemia could not be completely excluded.  She had normal contractility with an ejection fraction of 66%.  When I saw her.  I also recommended initiation of statin therapy in light of her lipid panel done by Dr. Coral Spikes in June, which showed a total cholesterol 255, triglycerides 52, HDL 79, but an LDL of 166.  She has been taking Crestor 10 mg.  She did have subsequent blood work, which showed normal LFTs, and chemistry.  Glucose was minimally increased at 107.  Hemoglobin 11.9, hematocrit 36.1.  She was supposed to  have an NMR lipoprotein L. but apparently the lab did not draw this blood.  She will have this done.  She has felt well.  She went to [place] and did note some mild shortness of breath with steep uphill walking on the volcanoes; otherwise, she denies any significant symptomatology.  She exercises regularly.  This I last saw her, she has remained active.  She denies any skin change in shortness of breath development.  He has been tolerating Crestor for hyperlipidemia.  2 months after initiation of therapy, and NMR profile showed significant benefit with her total cholesterol being reduced from 255 to169, her LDL from 166 to 75, and her LDL particle number was excellent at 857.  Insulin resistance score was excellent.  Her HDL was 83 and triglycerides were 53.  Initially she did feel mild myalgias but these have resolved.  He denies any recent chest pain.  She presents for reevaluation.  Medical history is also notable for significant esophageal reflux for which she takes concomitant therapy with Zantac, Zegerid, and Prevacid.  She also has intermittent, irritable bowel symptoms, and a history of asthma.  Surgical history is notable for hysterectomy in 1995 and cervical discectomy in 2004.  Allergies  Allergen Reactions  . Sulfa Antibiotics Other (See Comments)    Dyspnea and chest tightness  . Percocet [Oxycodone-Acetaminophen] Hives and Itching    Current Outpatient Prescriptions  Medication Sig Dispense Refill  . albuterol (PROVENTIL HFA;VENTOLIN HFA) 108 (90  BASE) MCG/ACT inhaler Inhale 1-2 puffs into the lungs every 6 (six) hours as needed for wheezing or shortness of breath.    Marland Kitchen aspirin 81 MG tablet Take 81 mg by mouth daily. Takes daily except when bleeding    . Calcium Carbonate-Vitamin D (CALCIUM-VITAMIN D3 PO) Take 1 tablet by mouth daily.    . Cholecalciferol (VITAMIN D) 2000 UNITS CAPS Take 1 capsule by mouth daily.    . CRESTOR 10 MG tablet TAKE 1 TABLET (10 MG TOTAL) BY MOUTH  DAILY. 30 tablet 8  . Estradiol (VAGIFEM) 10 MCG TABS vaginal tablet Place 1 tablet vaginally 2 (two) times a week.    . lansoprazole (PREVACID) 30 MG capsule Take 30 mg by mouth daily.    . magnesium oxide (MAG-OX) 400 MG tablet Take 400 mg by mouth daily.    . naproxen sodium (ANAPROX) 220 MG tablet take 2 tablets by mouth every evening.    . Omega-3 350 MG CAPS Take 1 capsule by mouth daily.    Earney Navy Bicarbonate (ZEGERID) 20-1100 MG CAPS capsule Take 1 capsule by mouth at bedtime.    Vladimir Faster Glycol-Propyl Glycol (SYSTANE ULTRA OP) Place 1 drop into both eyes daily as needed.    . polyethylene glycol (MIRALAX / GLYCOLAX) packet Take 17 g by mouth daily as needed.    . ranitidine (ZANTAC) 150 MG tablet Take 150 mg by mouth at bedtime.     No current facility-administered medications for this visit.   Socially, she is married to Dr. Jovita Mathews for 34 years.  She has 2 children who are both lowers and now live in [place].  She previously was as a Management consultant.  There is no tobacco history.  She rarely drinks alcohol.  She does exercise and performs one to 3 times per week.  She does walk with her husband.  Family History  Problem Relation Age of Onset  . Cancer Mother   . Hypertension Father   . Diabetes Maternal Grandmother   . Diabetes Paternal Grandfather     Pancreatic cancer    ROS General: Negative; No fevers, chills, or night sweats HEENT: Negative; No changes in vision or hearing, sinus congestion, difficulty swallowing Pulmonary: Positive for asthma; No cough, wheezing,hemoptysis Cardiovascular:  See HPI; positive for exertional shortness of breath and vague episodes of chest tightness;  presyncope, syncope, edema GI: Positive for esophageal reflux;  No nausea, vomiting, diarrhea, or abdominal pain GU: Positive for hysterectomy No dysuria, hematuria, or difficulty voiding Musculoskeletal: Negative; no myalgias, joint pain, or  weakness Hematologic/Oncologic: Negative; no easy bruising, bleeding Endocrine: Negative; no heat/cold intolerance; no diabetes Neuro: Negative; no changes in balance, headaches Skin: Negative; No rashes or skin lesions Psychiatric: Negative; No behavioral problems, depression Sleep: Negative; No daytime sleepiness, hypersomnolence, bruxism, restless legs, hypnogagnic hallucinations Other comprehensive 14 point system review is negative   Physical Exam BP 114/78 mmHg  Pulse 69  Ht _0  (1.575 m)  Wt 117 lb 12.8 oz (53.434 kg)  BMI 21.54 kg/m2 General: Alert, oriented, no distress.  Skin: normal turgor, no rashes, warm and dry HEENT: Normocephalic, atraumatic. Pupils equal round and reactive to light; sclera anicteric; extraocular muscles intact; Fundi no hemorrhages or exudates, no evidence for AV nicking.  Discs flat. Nose without nasal septal hypertrophy Mouth/Parynx benign; Mallinpatti scale 2 Neck: No JVD, no carotid bruits; normal carotid upstroke Lungs: clear to ausculatation and percussion; no wheezing or rales Chest wall: without tenderness to palpitation Heart: PMI not  displaced, RRR, s1 s2 normal, 1/6 systolic murmur, no diastolic murmur, no rubs, gallops, thrills, or heaves Abdomen: soft, nontender; no hepatosplenomehaly, BS+; abdominal aorta nontender and not dilated by palpation. Back: no CVA tenderness Pulses 2+ Musculoskeletal: full range of motion, normal strength, no joint deformities Extremities: no clubbing cyanosis or edema, Homan's sign negative  Neurologic: grossly nonfocal; Cranial nerves grossly wnl Psychologic: Normal mood and affect  ECG (independently read by me) : Normal sinus rhythm at 69 bpm.  No significant ST segment changes.  Normal intervals.    08/16/13 ECG (independently read by me): Normal sinus rhythm at 65 beats per minute.  PR interval normal at 124 ms.  QT 0.49 ms.  LABS:  BMET    Component Value Date/Time   NA 143 10/04/2013 0940    K 5.1 10/04/2013 0940   CL 103 10/04/2013 0940   CO2 28 10/04/2013 0940   GLUCOSE 107* 10/04/2013 0940   BUN 21 10/04/2013 0940   CREATININE 0.70 10/04/2013 0940   CREATININE 0.73 03/20/2010 1858   CALCIUM 9.6 10/04/2013 0940   GFRNONAA >60 03/20/2010 1858   GFRAA  03/20/2010 1858    >60        The eGFR has been calculated using the MDRD equation. This calculation has not been validated in all clinical situations. eGFR's persistently <60 mL/min signify possible Chronic Kidney Disease.     Hepatic Function Panel     Component Value Date/Time   PROT 7.3 10/04/2013 0940   ALBUMIN 3.8 10/04/2013 0940   AST 15 10/04/2013 0940   ALT 18 10/04/2013 0940   ALKPHOS 39 10/04/2013 0940   BILITOT 0.4 10/04/2013 0940     CBC    Component Value Date/Time   WBC 6.6 10/04/2013 0940   RBC 3.95 10/04/2013 0940   HGB 11.9* 10/04/2013 0940   HCT 36.1 10/04/2013 0940   PLT 254 10/04/2013 0940   MCV 91.4 10/04/2013 0940   MCH 30.1 10/04/2013 0940   MCHC 32.9 10/04/2013 0940   RDW 13.4 10/04/2013 0940   LYMPHSABS 2.6 03/20/2010 1858   MONOABS 0.5 03/20/2010 1858   EOSABS 0.1 03/20/2010 1858   BASOSABS 0.0 03/20/2010 1858     BNP No results found for: PROBNP  Lipid Panel   10/08/2013:   Ref Range 44moago    LDL Particle Number <1000 nmol/L 857   Comments: Reference Range  Low < 1000  Moderate 1000 - 1299  Borderline-High 1300 - 1599  High 1600 - 2000  Very High > 2000    LDL (calc) <100 mg/dL 75   Comments: LDL-C is inaccurate if patient is nonfasting. Reference Range  Optimal < 100  Near/Above Optimal 100 - 129  Borderline High 130 - 159  High 160 - 189  Very High >= 190    HDL-C >=40 mg/dL 83   Triglycerides <150 mg/dL 53   Cholesterol, Total <200 mg/dL 169   HDL Particle Number >=30.5 umol/L 34.3   Large HDL-P >=4.8 umol/L 18.5   Large VLDL-P <=2.7 nmol/L < 0.8   Small LDL Particle Number <=527 nmol/L 96   LDL Size >20.5 nm 21.3    HDL Size >=9.2 nm 10.5   VLDL Size nm See Note   Comments: VLDL concentration too low to allow determination of VLDL Size. Low VLDL concentration contributes minimally to the LP-IR score.   LP-IR Score <= 45  < 25          RADIOLOGY: No results found.  ASSESSMENT AND PLAN:  Ms  Fatina Sprankle is a very pleasant 54 year old female, who has been active for most her life.  She does have a history of significant GERD for which he takes Zegerid as well as Prevacid and ranitidine.  Remotey, she developed significant, morning hoarseness as result of gastric reflux.  There is also a history of asthma, for which he takes Proventil on an as-needed basis.  She has noticed exertional shortness of breath, particularly with steep hills.  I again reviewed her echo Doppler data, as well as a nuclear perfusion study and also reviewed the images personally of her Myoview.  She does have firm breast tissue and I suspect the apical anterolateral defect is most likely due to breast induced artifact.  She had normal contractility and normal systolic thickening of all myocardial segments.  Her ejection fraction is normal.  However, she was found to have mild diastolic dysfunction .  She may contribute to some of her exertional dyspnea.  She is tolerating Crestor for hyperlipidemia and has demonstrated significant benefit since institution of therapy.  I have suggested coenzyme Q 10 supplementation, which may improve some of her energy as well as potential for myalgias.  Her blood pressure today is well controlled.  Her ECG remained stable.  She will continue current therapy as prescribed.  In August.  She will undergo a one-year follow-up laboratory assessment, and I will see her in the office for reevaluation   Troy Sine, MD, [facility] 04/14/2014 9:23 PM

## 2014-07-07 ENCOUNTER — Telehealth: Payer: Self-pay | Admitting: Cardiovascular Disease

## 2014-07-13 NOTE — Telephone Encounter (Signed)
Close encounter 

## 2014-09-23 ENCOUNTER — Encounter: Payer: Self-pay | Admitting: *Deleted

## 2014-09-27 ENCOUNTER — Other Ambulatory Visit: Payer: Self-pay | Admitting: *Deleted

## 2014-09-27 ENCOUNTER — Encounter: Payer: Self-pay | Admitting: *Deleted

## 2014-09-27 DIAGNOSIS — E785 Hyperlipidemia, unspecified: Secondary | ICD-10-CM

## 2014-10-21 ENCOUNTER — Encounter: Payer: Self-pay | Admitting: *Deleted

## 2014-10-21 LAB — COMPREHENSIVE METABOLIC PANEL
ALBUMIN: 4 g/dL (ref 3.6–5.1)
ALK PHOS: 42 U/L (ref 33–130)
ALT: 11 U/L (ref 6–29)
AST: 16 U/L (ref 10–35)
BUN: 17 mg/dL (ref 7–25)
CHLORIDE: 104 mmol/L (ref 98–110)
CO2: 30 mmol/L (ref 20–31)
CREATININE: 0.71 mg/dL (ref 0.50–1.05)
Calcium: 9.3 mg/dL (ref 8.6–10.4)
Glucose, Bld: 96 mg/dL (ref 65–99)
Potassium: 4.5 mmol/L (ref 3.5–5.3)
SODIUM: 140 mmol/L (ref 135–146)
TOTAL PROTEIN: 6.8 g/dL (ref 6.1–8.1)
Total Bilirubin: 0.5 mg/dL (ref 0.2–1.2)

## 2014-10-21 LAB — LIPID PANEL
CHOLESTEROL: 161 mg/dL (ref 125–200)
HDL: 85 mg/dL (ref >=46)
LDL Cholesterol: 68 mg/dL (ref <130)
TRIGLYCERIDES: 42 mg/dL (ref <150)
Total CHOL/HDL Ratio: 1.9 Ratio (ref <=5.0)
VLDL: 8 mg/dL (ref <30)

## 2014-10-21 LAB — CBC
HCT: 35.5 % — ABNORMAL LOW (ref 36.0–46.0)
HEMOGLOBIN: 11.6 g/dL — AB (ref 12.0–15.0)
MCH: 29.2 pg (ref 26.0–34.0)
MCHC: 32.7 g/dL (ref 30.0–36.0)
MCV: 89.4 fL (ref 78.0–100.0)
MPV: 9.1 fL (ref 8.6–12.4)
Platelets: 256 10*3/uL (ref 150–400)
RBC: 3.97 MIL/uL (ref 3.87–5.11)
RDW: 13.7 % (ref 11.5–15.5)
WBC: 5.5 10*3/uL (ref 4.0–10.5)

## 2014-10-21 LAB — TSH: TSH: 2.466 u[IU]/mL (ref 0.350–4.500)

## 2014-11-03 ENCOUNTER — Encounter: Payer: Self-pay | Admitting: Cardiovascular Disease

## 2014-11-03 ENCOUNTER — Ambulatory Visit (INDEPENDENT_AMBULATORY_CARE_PROVIDER_SITE_OTHER): Payer: PRIVATE HEALTH INSURANCE | Admitting: Cardiovascular Disease

## 2014-11-03 VITALS — BP 114/76 | HR 61 | Ht 62.0 in | Wt 115.3 lb

## 2014-11-03 DIAGNOSIS — R0609 Other forms of dyspnea: Secondary | ICD-10-CM

## 2014-11-03 DIAGNOSIS — E785 Hyperlipidemia, unspecified: Secondary | ICD-10-CM | POA: Diagnosis not present

## 2014-11-03 DIAGNOSIS — J4520 Mild intermittent asthma, uncomplicated: Secondary | ICD-10-CM | POA: Diagnosis not present

## 2014-11-03 DIAGNOSIS — K219 Gastro-esophageal reflux disease without esophagitis: Secondary | ICD-10-CM | POA: Diagnosis not present

## 2014-11-03 NOTE — Patient Instructions (Signed)
Your physician wants you to follow-up in: 1 year or sooner if needed with Dr. Kelly. You will receive a reminder letter in the mail two months in advance. If you don't receive a letter, please call our office to schedule the follow-up appointment. 

## 2014-11-03 NOTE — Progress Notes (Signed)
Patient ID: [name], female   DOB: 07-Jul-1960, 54 y.o.   MRN: [id number]    Primary MD: Dr. Kelton Pillar  HPI:  [name] is a 54 y.o. female  who presents for a six-month follow-up cardiology evaluation.  She is the wife of Dr. Jovita Mathews.   [name] denies any prior cardiac history.  She has remained fairly active and walks regularly with her husband.  Last year she noticed a definite change with development of more shortness of breath with activity and experienced a vague sensation of chest tightness which can occur at night.  She does not believe this is due to asthma.  She also has noticed some occasional palpitations.  She saw Dr. Laurann Montana on 08/04/2013 and saw me for initial cardiology evaluation shortly thereafter.  A 2-D echo Doppler study on 09/01/2013  showed an ejection fraction of 55-60%.  She had normal wall motion without regional wall motion abnormalities.  Doppler parameters are consistent with mild grade 1 diastolic dysfunction.  There was trivial aortic insufficiency.  She underwent a nuclear perfusion study and exercise for 10 minutes and 21 seconds.  She stopped secondary to fatigue.  There were no diagnostic ST changes.  It was reported that she had a mild hypertensive response to exercise.  There was a mild defect  in the anteroapical region and it was felt most likely that this was breast attenuation artifact.  However, a minimal region of ischemia could not be completely excluded.  She had normal contractility with an ejection fraction of 66%.  When I saw her.  I also recommended initiation of statin therapy in light of her lipid panel done by Dr. Coral Spikes in June 2015 which showed a total cholesterol 255, triglycerides 52, HDL 79, but an LDL of 166.  She has been taking Crestor 10 mg.  She did have subsequent blood work, which showed normal LFTs, and chemistry.  Glucose was minimally increased at 107.  Hemoglobin 11.9, hematocrit 36.1.   She has been  tolerating Crestor for hyperlipidemia.  2 months after initiation of therapy, an NMR profile showed significant benefit with her total cholesterol being reduced from 255 to169, her LDL from 166 to 75, and her LDL particle number was excellent at 857.  Insulin resistance score was excellent.  Her HDL was 83 and triglycerides were 53.  Initially she did feel mild myalgias but these have resolved.  He denies any recent chest pain.   Since I last saw her, she has continued to do well.  She does classes and tolerates this well without shortness of breath.  At times she does note some mild shortness of breath with a pill steep climbing.  [place] this [place] and actually felt her breathing was better.  When compared to her prior trip with reference to shortness of breath.  She underwent repeat blood work 2 weeks ago.  She was minimally anemic with a hemoglobin of 11.6 and hematocrit 35.5.  Lipid studies remained excellent on Crestor with a total cholesterol 161, triglycerides 42, HDL 85, and LDL 68.  Her fasting glucose had improved and was now normal at 96.  Medical history is also notable for significant esophageal reflux for which she takes concomitant therapy with Zantac, Zegerid, and Prevacid.  She also has intermittent, irritable bowel symptoms, and a history of asthma.  Surgical history is notable for hysterectomy in 1995 and cervical discectomy in 2004.  Allergies  Allergen Reactions  .  Sulfa Antibiotics Other (See Comments)    Dyspnea and chest tightness  . Percocet [Oxycodone-Acetaminophen] Hives and Itching    Current Outpatient Prescriptions  Medication Sig Dispense Refill  . albuterol (PROVENTIL HFA;VENTOLIN HFA) 108 (90 BASE) MCG/ACT inhaler Inhale 1-2 puffs into the lungs every 6 (six) hours as needed for wheezing or shortness of breath.    Marland Kitchen aspirin 81 MG tablet Take 81 mg by mouth daily. Takes daily except when bleeding    . Calcium Carbonate-Vitamin D  (CALCIUM-VITAMIN D3 PO) Take 1 tablet by mouth daily.    . carboxymethylcellulose (RETAINE CMC) 0.5 % SOLN 1 drop daily.    . Cholecalciferol (VITAMIN D) 2000 UNITS CAPS Take 1 capsule by mouth daily.    . CRESTOR 10 MG tablet TAKE 1 TABLET (10 MG TOTAL) BY MOUTH DAILY. 30 tablet 8  . Estradiol (VAGIFEM) 10 MCG TABS vaginal tablet Place 1 tablet vaginally 2 (two) times a week.    . lansoprazole (PREVACID) 30 MG capsule Take 30 mg by mouth daily.    . magnesium oxide (MAG-OX) 400 MG tablet Take 400 mg by mouth daily.    . naproxen sodium (ANAPROX) 220 MG tablet take 2 tablets by mouth every evening.    . Omega-3 350 MG CAPS Take 1 capsule by mouth daily.    Earney Navy Bicarbonate (ZEGERID) 20-1100 MG CAPS capsule Take 1 capsule by mouth at bedtime.    . polyethylene glycol (MIRALAX / GLYCOLAX) packet Take 17 g by mouth daily as needed.    . ranitidine (ZANTAC) 150 MG tablet Take 150 mg by mouth at bedtime.     No current facility-administered medications for this visit.   Socially, she is married to Dr. Jovita Mathews for 34 years.  She has 2 children who are both lowers and now live in [place].  She previously was as a Management consultant.  There is no tobacco history.  She rarely drinks alcohol.  She does exercise and performs one to 3 times per week.  She does walk with her husband.  Family History  Problem Relation Age of Onset  . Cancer Mother   . Hypertension Father   . Diabetes Maternal Grandmother   . Diabetes Paternal Grandfather     Pancreatic cancer   Family history is also notable in that her father died with Parkinson's disease.  ROS General: Negative; No fevers, chills, or night sweats HEENT: Negative; No changes in vision or hearing, sinus congestion, difficulty swallowing Pulmonary: Positive for asthma; No cough, wheezing,hemoptysis Cardiovascular:  See HPI; positive for exertional shortness of breath and vague episodes of chest tightness;  presyncope,  syncope, edema GI: Positive for esophageal reflux;  No nausea, vomiting, diarrhea, or abdominal pain GU: Positive for hysterectomy No dysuria, hematuria, or difficulty voiding Musculoskeletal: Negative; no myalgias, joint pain, or weakness Hematologic/Oncologic: Negative; no easy bruising, bleeding Endocrine: Negative; no heat/cold intolerance; no diabetes Neuro: Negative; no changes in balance, headaches Skin: Negative; No rashes or skin lesions Psychiatric: Negative; No behavioral problems, depression Sleep: Negative; No daytime sleepiness, hypersomnolence, bruxism, restless legs, hypnogagnic hallucinations Other comprehensive 14 point system review is negative   Physical Exam BP 114/76 mmHg  Pulse 61  Ht '5\' 2"'  (1.575 m)  Wt 115 lb 4.8 oz (52.3 kg)  BMI 21.08 kg/m2  Wt Readings from Last 3 Encounters:  11/03/14 115 lb 4.8 oz (52.3 kg)  04/13/14 117 lb 12.8 oz (53.434 kg)  10/07/13 117 lb 12.8 oz (53.434 kg)   General: Alert,  oriented, no distress.  Skin: normal turgor, no rashes, warm and dry HEENT: Normocephalic, atraumatic. Pupils equal round and reactive to light; sclera anicteric; extraocular muscles intact; Fundi no hemorrhages or exudates, no evidence for AV nicking.  Discs flat. Nose without nasal septal hypertrophy Mouth/Parynx benign; Mallinpatti scale 2 Neck: No JVD, no carotid bruits; normal carotid upstroke Lungs: clear to ausculatation and percussion; no wheezing or rales Chest wall: without tenderness to palpitation Heart: PMI not displaced, RRR, s1 s2 normal, 1/6 systolic murmur, no diastolic murmur, no rubs, gallops, thrills, or heaves Abdomen: soft, nontender; no hepatosplenomehaly, BS+; abdominal aorta nontender and not dilated by palpation. Back: no CVA tenderness Pulses 2+ Musculoskeletal: full range of motion, normal strength, no joint deformities Extremities: no clubbing cyanosis or edema, Homan's sign negative  Neurologic: grossly nonfocal; Cranial  nerves grossly wnl Psychologic: Normal mood and affect  ECG (independently read by me): Normal sinus rhythm at 61 bpm.  Normal intervals.  No ST segment changes.  February 2016 ECG (independently read by me) : Normal sinus rhythm at 69 bpm.  No significant ST segment changes.  Normal intervals.    08/16/13 ECG (independently read by me): Normal sinus rhythm at 65 beats per minute.  PR interval normal at 124 ms.  QT 0.49 ms.  LABS: BMP Latest Ref Rng 10/20/2014 10/04/2013 03/20/2010  Glucose 65 - 99 mg/dL 96 107(H) 125(H)  BUN 7 - 25 mg/dL '17 21 10  ' Creatinine 0.50 - 1.05 mg/dL 0.71 0.70 0.73  Sodium 135 - 146 mmol/L 140 143 135  Potassium 3.5 - 5.3 mmol/L 4.5 5.1 3.6  Chloride 98 - 110 mmol/L 104 103 98  CO2 20 - 31 mmol/L '30 28 28  ' Calcium 8.6 - 10.4 mg/dL 9.3 9.6 9.4   Hepatic Function Latest Ref Rng 10/20/2014 10/04/2013 03/20/2010  Total Protein 6.1 - 8.1 g/dL 6.8 7.3 7.7  Albumin 3.6 - 5.1 g/dL 4.0 3.8 4.0  AST 10 - 35 U/L '16 15 25  ' ALT 6 - 29 U/L '11 18 20  ' Alk Phosphatase 33 - 130 U/L 42 39 58  Total Bilirubin 0.2 - 1.2 mg/dL 0.5 0.4 0.6   CBC Latest Ref Rng 10/20/2014 10/04/2013 03/20/2010  WBC 4.0 - 10.5 K/uL 5.5 6.6 6.6  Hemoglobin 12.0 - 15.0 g/dL 11.6(L) 11.9(L) 12.3  Hematocrit 36.0 - 46.0 % 35.5(L) 36.1 37.6  Platelets 150 - 400 K/uL 256 254 241   Lab Results  Component Value Date   MCV 89.4 10/20/2014   MCV 91.4 10/04/2013   MCV 90.0 03/20/2010   Lab Results  Component Value Date   TSH 2.466 10/20/2014  No results found for: HGBA1C  Lipid Panel     Component Value Date/Time   CHOL 161 10/20/2014 0933   CHOL 169 10/08/2013 0911   TRIG 42 10/20/2014 0933   TRIG 53 10/08/2013 0911   HDL 85 10/20/2014 0933   HDL 83 10/08/2013 0911   CHOLHDL 1.9 10/20/2014 0933   VLDL 8 10/20/2014 0933   LDLCALC 68 10/20/2014 0933   LDLCALC 75 10/08/2013 0911    NMR Lipid Panel  10/08/2013:   Ref Range 81moago    LDL Particle Number <1000 nmol/L 857   Comments:  Reference Range  Low < 1000  Moderate 1000 - 1299  Borderline-High 1300 - 1599  High 1600 - 2000  Very High > 2000    LDL (calc) <100 mg/dL 75   Comments: LDL-C is inaccurate if patient is nonfasting. Reference Range  Optimal <  100  Near/Above Optimal 100 - 129  Borderline High 130 - 159  High 160 - 189  Very High >= 190    HDL-C >=40 mg/dL 83   Triglycerides <150 mg/dL 53   Cholesterol, Total <200 mg/dL 169   HDL Particle Number >=30.5 umol/L 34.3   Large HDL-P >=4.8 umol/L 18.5   Large VLDL-P <=2.7 nmol/L < 0.8   Small LDL Particle Number <=527 nmol/L 96   LDL Size >20.5 nm 21.3   HDL Size >=9.2 nm 10.5   VLDL Size nm See Note   Comments: VLDL concentration too low to allow determination of VLDL Size. Low VLDL concentration contributes minimally to the LP-IR score.   LP-IR Score <= 45  < 25          RADIOLOGY: No results found.   ASSESSMENT AND PLAN: Ms  Cniyah Sproull is a 54 year old female, who has a history of significant GERD for which he takes Zegerid as well as Prevacid and ranitidine.  Remotey, she developed significant, morning hoarseness as result of gastric reflux.  She has a history of mild asthma, for which he takes Proventil on an as-needed basis.  Last year she began to notice  exertional shortness of breath, particularly with steep hills.  I again reviewed her echo Doppler data, as well as a nuclear perfusion study and also reviewed the images personally of her Myoview.  She does have firm breast tissue and I suspect the apical anterolateral defect is most likely due to breast induced artifact.  She had normal contractility and normal systolic thickening of all myocardial segments.  Her ejection fraction is normal.  However, she was found to have mild diastolic dysfunction .  She may contribute to some of her exertional dyspnea.  She is tolerating Crestor for hyperlipidemia with significant improvement from her baseline elevation.  She is  not having any chest pain.  Her shortness of breath has improved.  Her most recent laboratory also shows improvement in her fasting glucose level.  I have recommended she continue current therapy as prescribed.  She will be going to [place] later this fall with her husband to celebrate their 70th wedding anniversary.  As long as she remains stable, I will see her in one year for reevaluation.    Troy Sine, MD, [facility] 11/03/2014 7:08 PM

## 2014-11-16 ENCOUNTER — Other Ambulatory Visit: Payer: Self-pay | Admitting: Cardiovascular Disease

## 2014-11-16 NOTE — Telephone Encounter (Signed)
REFILL 

## 2015-04-10 ENCOUNTER — Other Ambulatory Visit: Payer: Self-pay

## 2015-04-10 DIAGNOSIS — Z1231 Encounter for screening mammogram for malignant neoplasm of breast: Secondary | ICD-10-CM

## 2015-05-02 ENCOUNTER — Ambulatory Visit
Admission: RE | Admit: 2015-05-02 | Discharge: 2015-05-02 | Disposition: A | Discharge status: Home / Self Care | Payer: PRIVATE HEALTH INSURANCE | Source: Ambulatory Visit

## 2015-05-02 DIAGNOSIS — Z1231 Encounter for screening mammogram for malignant neoplasm of breast: Secondary | ICD-10-CM

## 2015-11-01 ENCOUNTER — Other Ambulatory Visit: Payer: Self-pay | Admitting: Cardiovascular Disease

## 2015-12-18 ENCOUNTER — Encounter: Payer: Self-pay | Admitting: Cardiovascular Disease

## 2015-12-18 ENCOUNTER — Ambulatory Visit (INDEPENDENT_AMBULATORY_CARE_PROVIDER_SITE_OTHER): Payer: PRIVATE HEALTH INSURANCE | Admitting: Cardiovascular Disease

## 2015-12-18 VITALS — BP 109/74 | HR 62 | Ht 61.5 in | Wt 119.0 lb

## 2015-12-18 DIAGNOSIS — K219 Gastro-esophageal reflux disease without esophagitis: Secondary | ICD-10-CM

## 2015-12-18 DIAGNOSIS — R0609 Other forms of dyspnea: Secondary | ICD-10-CM | POA: Diagnosis not present

## 2015-12-18 DIAGNOSIS — E785 Hyperlipidemia, unspecified: Secondary | ICD-10-CM

## 2015-12-18 NOTE — Patient Instructions (Signed)
Your physician wants you to follow-up in: 1 year or sooner if needed. You will receive a reminder letter in the mail two months in advance. If you don't receive a letter, please call our office to schedule the follow-up appointment.   If you need a refill on your cardiac medications before your next appointment, please call your pharmacy.   

## 2015-12-18 NOTE — Progress Notes (Signed)
Patient ID: [name], female   DOB: [birth year], 55 y.o.   MRN: [id number]    Primary MD: Dr. Kelton Pillar  HPI:  [name] is a 55 y.o. female  who presents for a six-month follow-up cardiology evaluation.  She is the wife of Dr. [name].   [name] denies any prior cardiac history.  She has remained fairly active and walks regularly with her husband.  Last year she noticed a definite change with development of more shortness of breath with activity and experienced a vague sensation of chest tightness which can occur at night.  She does not believe this is due to asthma.  She also has noticed some occasional palpitations.  She saw Dr. Laurann Montana on 08/04/2013 and saw me for initial cardiology evaluation shortly thereafter.  A 2-D echo Doppler study on 09/01/2013  showed an ejection fraction of 55-60%.  She had normal wall motion without regional wall motion abnormalities.  Doppler parameters are consistent with mild grade 1 diastolic dysfunction.  There was trivial aortic insufficiency.  She underwent a nuclear perfusion study and exercise for 10 minutes and 21 seconds.  She stopped secondary to fatigue.  There were no diagnostic ST changes.  It was reported that she had a mild hypertensive response to exercise.  There was a mild defect  in the anteroapical region and it was felt most likely that this was breast attenuation artifact.  However, a minimal region of ischemia could not be completely excluded.  She had normal contractility with an ejection fraction of 66%.  When I saw her I recommended initiation of statin therapy in light of her lipid panel done by Dr. Coral Spikes in June 2015 which showed a total cholesterol 255, triglycerides 52, HDL 79, but an LDL of 166.  She has been taking Crestor 10 mg.  She did have subsequent blood work, which showed normal LFTs, and chemistry.  Glucose was minimally increased at 107.  Hemoglobin 11.9, hematocrit 36.1.   She has been tolerating  Crestor for hyperlipidemia.  2 months after initiation of therapy, an NMR profile showed significant benefit with her total cholesterol being reduced from 255 to169, her LDL from 166 to 75, and her LDL particle number was excellent at 857.  Insulin resistance score was excellent.  Her HDL was 83 and triglycerides were 53.  Initially she did feel mild myalgias but these have resolved.  He denies any recent chest pain.   Since I last saw her, she has continued to do well.  She does classes and tolerates this well without shortness of breath.  At times she does note some mild shortness of breath with a pill steep climbing.  [place] this [place] and actually felt her breathing was better.  When compared to her prior trip with reference to shortness of breath.  Lab work in 2016 revealed her to be  minimally anemic with a hemoglobin of 11.6 and hematocrit 35.5.  Lipid studies remained excellent on Crestor with a total cholesterol 161, triglycerides 42, HDL 85, and LDL 68.  Her fasting glucose had improved and was now normal at 96.  She tells me she had follow-up blood work in July.  She never heard the specifics of the blood results.  She denies any episodes of chest pain or shortness of breath.  She is now exercising 5 days per week.  She notes a very rare isolated PVC.  She admits to occasional joint aches but denies  myalgias.  She presents for one-year evaluation.  Medical history is also notable for significant esophageal reflux for which she takes concomitant therapy with Zantac, Zegerid, and Prevacid.  She also has intermittent, irritable bowel symptoms, and a history of asthma.  Surgical history is notable for hysterectomy in 1995 and cervical discectomy in 2004.  Allergies  Allergen Reactions  . Sulfa Antibiotics Other (See Comments)    Dyspnea and chest tightness  . Percocet [Oxycodone-Acetaminophen] Hives and Itching    Current Outpatient Prescriptions  Medication Sig  Dispense Refill  . albuterol (PROVENTIL HFA;VENTOLIN HFA) 108 (90 BASE) MCG/ACT inhaler Inhale 1-2 puffs into the lungs every 6 (six) hours as needed for wheezing or shortness of breath.    Marland Kitchen aspirin 81 MG tablet Take 81 mg by mouth daily. Takes daily except when bleeding    . Calcium Carbonate-Vitamin D (CALCIUM-VITAMIN D3 PO) Take 1 tablet by mouth daily.    . carboxymethylcellulose (RETAINE CMC) 0.5 % SOLN 1 drop daily.    . Cholecalciferol (VITAMIN D) 2000 UNITS CAPS Take 1 capsule by mouth daily.    . Estradiol (VAGIFEM) 10 MCG TABS vaginal tablet Place 1 tablet vaginally 2 (two) times a week.    . lansoprazole (PREVACID) 30 MG capsule Take 30 mg by mouth daily.    . magnesium oxide (MAG-OX) 400 MG tablet Take 400 mg by mouth daily.    . naproxen sodium (ANAPROX) 220 MG tablet take 2 tablets by mouth every evening.    . Omega-3 350 MG CAPS Take 1 capsule by mouth daily.    Earney Navy Bicarbonate (ZEGERID) 20-1100 MG CAPS capsule Take 1 capsule by mouth at bedtime.    . Probiotic Product (ALIGN PO) Take 1 tablet by mouth every morning.    . ranitidine (ZANTAC) 150 MG tablet Take 150 mg by mouth at bedtime.    . rosuvastatin (CRESTOR) 10 MG tablet TAKE 1 TABLET (10 MG TOTAL) BY MOUTH DAILY. 30 tablet 2  . TRULANCE 3 MG TABS Take 1 tablet by mouth daily.  3   No current facility-administered medications for this visit.    Socially, she is married to Dr. [name] for 34 years.  She has 2 children who are both lowers and now live in [place].  She previously was as a Management consultant.  There is no tobacco history.  She rarely drinks alcohol.  She does exercise and performs one to 3 times per week.  She does walk with her husband.  Family History  Problem Relation Age of Onset  . Cancer Mother   . Hypertension Father   . Diabetes Maternal Grandmother   . Diabetes Paternal Grandfather     Pancreatic cancer   Family history is also notable in that her father died with  Parkinson's disease.  ROS General: Negative; No fevers, chills, or night sweats HEENT: Negative; No changes in vision or hearing, sinus congestion, difficulty swallowing Pulmonary: Positive for asthma; No cough, wheezing,hemoptysis Cardiovascular:  See HPI; positive for exertional shortness of breath and vague episodes of chest tightness;  presyncope, syncope, edema GI: Positive for esophageal reflux;  No nausea, vomiting, diarrhea, or abdominal pain GU: Positive for hysterectomy No dysuria, hematuria, or difficulty voiding Musculoskeletal: Negative; no myalgias, joint pain, or weakness Hematologic/Oncologic: Negative; no easy bruising, bleeding Endocrine: Negative; no heat/cold intolerance; no diabetes Neuro: Negative; no changes in balance, headaches Skin: Negative; No rashes or skin lesions Psychiatric: Negative; No behavioral problems, depression Sleep: Negative; No daytime sleepiness, hypersomnolence, bruxism,  restless legs, hypnogagnic hallucinations Other comprehensive 14 point system review is negative   Physical Exam BP 109/74   Pulse 62   Ht 5' 1.5" (1.562 m)   Wt 119 lb (54 kg)   BMI 22.12 kg/m   Wt Readings from Last 3 Encounters:  12/18/15 119 lb (54 kg)  11/03/14 115 lb 4.8 oz (52.3 kg)  04/13/14 117 lb 12.8 oz (53.4 kg)   General: Alert, oriented, no distress.  Skin: normal turgor, no rashes, warm and dry HEENT: Normocephalic, atraumatic. Pupils equal round and reactive to light; sclera anicteric; extraocular muscles intact; Fundi no hemorrhages or exudates, no evidence for AV nicking.  Discs flat. Nose without nasal septal hypertrophy Mouth/Parynx benign; Mallinpatti scale 2 Neck: No JVD, no carotid bruits; normal carotid upstroke Lungs: clear to ausculatation and percussion; no wheezing or rales Chest wall: without tenderness to palpitation Heart: PMI not displaced, RRR, s1 s2 normal, 1/6 systolic murmur, no diastolic murmur, no rubs, gallops, thrills, or  heaves Abdomen: soft, nontender; no hepatosplenomehaly, BS+; abdominal aorta nontender and not dilated by palpation. Back: no CVA tenderness Pulses 2+ Musculoskeletal: full range of motion, normal strength, no joint deformities Extremities: no clubbing cyanosis or edema, Homan's sign negative  Neurologic: grossly nonfocal; Cranial nerves grossly wnl Psychologic: Normal mood and affect  ECG (independently read by me): Normal sinus rhythm at 62 bpm.  Normal intervals.  No ST segment changes.  September 2016 ECG (independently read by me): Normal sinus rhythm at 61 bpm.  Normal intervals.  No ST segment changes.  February 2016 ECG (independently read by me) : Normal sinus rhythm at 69 bpm.  No significant ST segment changes.  Normal intervals.    08/16/13 ECG (independently read by me): Normal sinus rhythm at 65 beats per minute.  PR interval normal at 124 Marcia.  QT 0.49 Marcia.  LABS: BMP Latest Ref Rng & Units 10/20/2014 10/04/2013 03/20/2010  Glucose 65 - 99 mg/dL 96 107(H) 125(H)  BUN 7 - 25 mg/dL _0 Creatinine 0.50 - 1.05 mg/dL 0.71 0.70 0.73  Sodium 135 - 146 mmol/L 140 143 135  Potassium 3.5 - 5.3 mmol/L 4.5 5.1 3.6  Chloride 98 - 110 mmol/L 104 103 98  CO2 20 - 31 mmol/L _1 Calcium 8.6 - 10.4 mg/dL 9.3 9.6 9.4   Hepatic Function Latest Ref Rng & Units 10/20/2014 10/04/2013 03/20/2010  Total Protein 6.1 - 8.1 g/dL 6.8 7.3 7.7  Albumin 3.6 - 5.1 g/dL 4.0 3.8 4.0  AST 10 - 35 U/L _2 ALT 6 - 29 U/L _3 Alk Phosphatase 33 - 130 U/L 42 39 58  Total Bilirubin 0.2 - 1.2 mg/dL 0.5 0.4 0.6   CBC Latest Ref Rng & Units 10/20/2014 10/04/2013 03/20/2010  WBC 4.0 - 10.5 K/uL 5.5 6.6 6.6  Hemoglobin 12.0 - 15.0 g/dL 11.6(L) 11.9(L) 12.3  Hematocrit 36.0 - 46.0 % 35.5(L) 36.1 37.6  Platelets 150 - 400 K/uL 256 254 241   Lab Results  Component Value Date   MCV 89.4 10/20/2014   MCV 91.4 10/04/2013   MCV 90.0 03/20/2010   Lab Results  Component Value Date   TSH 2.466  10/20/2014  No results found for: HGBA1C  Lipid Panel     Component Value Date/Time   CHOL 161 10/20/2014 0933   CHOL 169 10/08/2013 0911   TRIG 42 10/20/2014 0933   TRIG 53 10/08/2013 0911   HDL 85 10/20/2014 0933  HDL 83 10/08/2013 0911   CHOLHDL 1.9 10/20/2014 0933   VLDL 8 10/20/2014 0933   LDLCALC 68 10/20/2014 0933   LDLCALC 75 10/08/2013 0911    NMR Lipid Panel  10/08/2013:   Ref Range 35moago    LDL Particle Number <1000 nmol/L 857   Comments: Reference Range  Low < 1000  Moderate 1000 - 1299  Borderline-High 1300 - 1599  High 1600 - 2000  Very High > 2000    LDL (calc) <100 mg/dL 75   Comments: LDL-C is inaccurate if patient is nonfasting. Reference Range  Optimal < 100  Near/Above Optimal 100 - 129  Borderline High 130 - 159  High 160 - 189  Very High >= 190    HDL-C >=40 mg/dL 83   Triglycerides <150 mg/dL 53   Cholesterol, Total <200 mg/dL 169   HDL Particle Number >=30.5 umol/L 34.3   Large HDL-P >=4.8 umol/L 18.5   Large VLDL-P <=2.7 nmol/L < 0.8   Small LDL Particle Number <=527 nmol/L 96   LDL Size >20.5 nm 21.3   HDL Size >=9.2 nm 10.5   VLDL Size nm See Note   Comments: VLDL concentration too low to allow determination of VLDL Size. Low VLDL concentration contributes minimally to the LP-IR score.   LP-IR Score <= 45  < 25          RADIOLOGY: No results found.   ASSESSMENT AND PLAN: Marcia  EChyrel Mathews a 55year old female who has a history of significant GERD for which he takes Zegerid as well as Prevacid and ranitidine.  Remotey, she developed significant, morning hoarseness as result of gastric reflux.  She has a history of mild asthma, for which he takes Proventil on an as-needed basis.  Remotely she had noticed  exertional shortness of breath, particularly with steep hills.  This has essentially resolved and now she is exercising regularly without any dyspnea.  She admits to rare episode of isolated  palpitations which occur every 4-5 months and last for seconds.  I again reviewed her echo Doppler data, as well as a nuclear perfusion study and also reviewed the images personally of her Myoview.  She does have firm breast tissue and I suspect the apical anterolateral defect is most likely due to breast induced artifact.  She had normal contractility and normal systolic thickening of all myocardial segments.  Her ejection fraction is normal.  However, she was found to have mild diastolic dysfunction .  She may contribute to some of her exertional dyspnea.  She continues to be on Crestor for hyperlipidemia with significant improvement from her baseline elevation.  She states that does note some occasional joint aches and was concerned perhaps this may be related to the Crestor.  She believes she can tolerate this and if it was Crestor related wanted to make sure that this potentially was not causing any significant long-term effects.  I will try to obtain the results of the blood work that she had done by Dr. GCoral Spikes  If her LDL cholesterol remains aggressively treated.  I have suggested she reduce her Crestor and take it every other day.  However, if her cholesterol level is in the upper 70s or  higher she should continue her current dose.  She is not having any chest pain.  Her shortness of breath has resolved.  I recommended that she continue with her at least 5 day per week exercise program.  As long as she is stable, I  will see her one year for reevaluation.    Troy Sine, MD, [facility] 12/18/2015 6:16 PM

## 2015-12-22 ENCOUNTER — Ambulatory Visit: Payer: PRIVATE HEALTH INSURANCE | Admitting: Cardiovascular Disease

## 2016-01-23 ENCOUNTER — Other Ambulatory Visit: Payer: Self-pay | Admitting: Cardiovascular Disease

## 2016-03-28 ENCOUNTER — Other Ambulatory Visit: Payer: Self-pay | Admitting: Obstetrics and Gynecology

## 2016-03-28 DIAGNOSIS — Z1231 Encounter for screening mammogram for malignant neoplasm of breast: Secondary | ICD-10-CM

## 2016-05-06 ENCOUNTER — Ambulatory Visit: Payer: PRIVATE HEALTH INSURANCE

## 2016-05-20 ENCOUNTER — Other Ambulatory Visit: Payer: Self-pay | Admitting: Neurological Surgery

## 2016-05-22 ENCOUNTER — Ambulatory Visit
Admission: RE | Admit: 2016-05-22 | Discharge: 2016-05-22 | Disposition: A | Discharge status: Home / Self Care | Payer: PRIVATE HEALTH INSURANCE | Source: Ambulatory Visit | Attending: Obstetrics and Gynecology | Admitting: Obstetrics and Gynecology

## 2016-05-22 DIAGNOSIS — Z1231 Encounter for screening mammogram for malignant neoplasm of breast: Secondary | ICD-10-CM

## 2016-06-17 ENCOUNTER — Encounter (HOSPITAL_COMMUNITY): Payer: Self-pay

## 2016-06-17 ENCOUNTER — Encounter (HOSPITAL_COMMUNITY)
Admission: RE | Admit: 2016-06-17 | Discharge: 2016-06-17 | Disposition: A | Discharge status: Home / Self Care | Payer: PRIVATE HEALTH INSURANCE | Source: Ambulatory Visit | Attending: Neurological Surgery | Admitting: Neurological Surgery

## 2016-06-17 DIAGNOSIS — Z01818 Encounter for other preprocedural examination: Secondary | ICD-10-CM | POA: Diagnosis present

## 2016-06-17 DIAGNOSIS — M5011 Cervical disc disorder with radiculopathy,  high cervical region: Secondary | ICD-10-CM | POA: Insufficient documentation

## 2016-06-17 HISTORY — DX: Gastro-esophageal reflux disease without esophagitis: K21.9

## 2016-06-17 HISTORY — DX: Unspecified osteoarthritis, unspecified site: M19.90

## 2016-06-17 HISTORY — DX: Dyspnea, unspecified: R06.00

## 2016-06-17 HISTORY — DX: Cardiac murmur, unspecified: R01.1

## 2016-06-17 HISTORY — DX: Unspecified asthma, uncomplicated: J45.909

## 2016-06-17 LAB — BASIC METABOLIC PANEL
ANION GAP: 8 (ref 5–15)
BUN: 19 mg/dL (ref 6–20)
CHLORIDE: 104 mmol/L (ref 101–111)
CO2: 26 mmol/L (ref 22–32)
Calcium: 9.5 mg/dL (ref 8.9–10.3)
Creatinine, Ser: 0.78 mg/dL (ref 0.44–1.00)
GFR calc Af Amer: >60 mL/min (ref >60)
GFR calc non Af Amer: >60 mL/min (ref >60)
Glucose, Bld: 107 mg/dL — ABNORMAL HIGH (ref 65–99)
POTASSIUM: 4 mmol/L (ref 3.5–5.1)
SODIUM: 138 mmol/L (ref 135–145)

## 2016-06-17 LAB — CBC
HEMATOCRIT: 37.4 % (ref 36.0–46.0)
HEMOGLOBIN: 12.1 g/dL (ref 12.0–15.0)
MCH: 28.5 pg (ref 26.0–34.0)
MCHC: 32.4 g/dL (ref 30.0–36.0)
MCV: 88.2 fL (ref 78.0–100.0)
Platelets: 270 10*3/uL (ref 150–400)
RBC: 4.24 MIL/uL (ref 3.87–5.11)
RDW: 14.2 % (ref 11.5–15.5)
WBC: 7.4 10*3/uL (ref 4.0–10.5)

## 2016-06-17 LAB — TYPE AND SCREEN
ABO/RH(D): O POS
ANTIBODY SCREEN: NEGATIVE

## 2016-06-17 LAB — SURGICAL PCR SCREEN
MRSA, PCR: NEGATIVE
Staphylococcus aureus: NEGATIVE

## 2016-06-17 LAB — ABO/RH: ABO/RH(D): O POS

## 2016-06-17 NOTE — Progress Notes (Signed)
PCP is Dr. Massie Maroon  LOV 07/2015 Cardio is Dr. Bishop Limbo  LOV 09/2015  Had echo & stress back in 2015 - negative results on both. Was told she had murmur as a child, Dr. Tresa Endo aware and she is currently having no issues.

## 2016-06-17 NOTE — Pre-Procedure Instructions (Signed)
    [name]  06/17/2016      [id number] - [place], [place] - [address] AT [facility] [address] [place] [address] Phone: [phone] Fax: [phone]    Your procedure is scheduled on May 1st, Tuesday   Report to [facility] Admitting at 5:30 AM             (posted surgery time 7:30 - 10:25 am)   Call this number if you have problems the MORNING of surgery:  [phone], otherwise for general questions [phone] Mon - Fri from 8-4:30 pm   Remember:  Do not eat food or drink liquids after midnight Monday.   Take these medicines the morning of surgery with A SIP OF WATER :  Prevacid              4-5 days prior to surgery, STOP taking any Vitamins, Herbal Supplements, Anti-inflammatories   Do not wear jewelry, make-up or nail polish.  Do not wear lotions, powders,  perfumes, or deoderant.  Do not shave 48 hours prior to surgery.     Do not bring valuables to the hospital.  [facility] is not responsible for any belongings or valuables.  Contacts, dentures or bridgework may not be worn into surgery.  Leave your suitcase in the car.  After surgery it may be brought to your room.  For patients admitted to the hospital, discharge time will be determined by your treatment team.   Please read over the following fact sheets that you were given. Pain Booklet, MRSA Information and Surgical Site Infection Prevention

## 2016-06-18 NOTE — Progress Notes (Addendum)
Anesthesia Chart Review: Patient is a 56 year old female scheduled for C3-4, C6-7, C7-T1 ACDF, removal of anterior cervical plates Y8-6 on 5/78/46 by Dr. Danielle Dess.  History includes never smoker, childhood murmur (trivial AR 2015), dyspnea on exertion, asthmatic bronchitis, GERD, arthritis, hysterectomy '95, C4-6 ACDF '04.   - PCP is Dr. Maurice Small.  - Cardiologist is Dr. Nicki Guadalajara, last visit 12/18/15. She was doing well at that time and able to exercise regularly without any dyspnea. She had occasional palpitations that lasted only a few seconds. He "again reviewed her echo Doppler data, as well as a nuclear perfusion study and also reviewed the images personally of her Myoview.  She does have firm breast tissue and I suspect the apical anterolateral defect is most likely due to breast induced artifact.  She had normal contractility and normal systolic thickening of all myocardial segments.  Her ejection fraction is normal.  However, she was found to have mild diastolic dysfunction." A regular exercise program recommended with one year follow-up.  Meds includes albuterol, ASA 81 mg (on hold), estradiol, Prevacid, Linzess, magnesium-oxide, omega-3 fish oil (told to hold per office), lansoprazole, Zegerid, Align, Zantac, Crestor.  BP 126/89   Pulse 72   Temp 36.6 C   Resp 18   Ht  (1.575 m)   Wt 116 lb 9.6 oz (52.9 kg)   SpO2 97%   BMI 21.33 kg/m   EKG 12/18/15: NSR.  Nuclear stress test 09/01/13: Overall Impression:   Indeterminate Study due to poor Resting Image data aquisition. Clinically, with a Duke TM Score of 10, notably in the absence of Angina, anteroapical ischmia is unlikely.  Clinical correlation is warranted.  Echo 09/01/13: Study Conclusions - Left ventricle: The cavity size was normal. Wall thickness was normal. Systolic function was normal. The estimated ejection fraction was in the range of 55% to 60%. Wall motion was normal; there were no regional wall  motion abnormalities. Doppler parameters are consistent with abnormal left ventricular relaxation (grade 1 diastolic dysfunction). - Aortic valve: There was trivial regurgitation. Impressions: - Normal LV function, grade 1 diastolic dysfunction; trace AI and MR.  Preoperative labs noted.   Above reviewed with anesthesiologist Dr. Michelle Piper. He will likely be her assigned anesthesiologist. If no acute changes then it is anticipated that she can proceed as planned.  Velna Ochs [facility] Short Stay Center/Anesthesiology Phone [phone] 06/18/2016 6:33 PM

## 2016-06-25 ENCOUNTER — Inpatient Hospital Stay (HOSPITAL_COMMUNITY)
Admission: RE | Disposition: A | Discharge status: Home / Self Care | Payer: Self-pay | Source: Ambulatory Visit | Attending: Neurological Surgery

## 2016-06-25 ENCOUNTER — Inpatient Hospital Stay (HOSPITAL_COMMUNITY): Payer: PRIVATE HEALTH INSURANCE | Admitting: Vascular Surgery

## 2016-06-25 ENCOUNTER — Inpatient Hospital Stay (HOSPITAL_COMMUNITY): Payer: PRIVATE HEALTH INSURANCE | Admitting: Anesthesiology

## 2016-06-25 ENCOUNTER — Encounter (HOSPITAL_COMMUNITY): Payer: Self-pay | Admitting: *Deleted

## 2016-06-25 ENCOUNTER — Inpatient Hospital Stay (HOSPITAL_COMMUNITY): Payer: PRIVATE HEALTH INSURANCE

## 2016-06-25 ENCOUNTER — Inpatient Hospital Stay (HOSPITAL_COMMUNITY)
Admission: RE | Admit: 2016-06-25 | Discharge: 2016-06-27 | DRG: 472 | Disposition: A | Discharge status: Home / Self Care | Payer: PRIVATE HEALTH INSURANCE | Source: Ambulatory Visit | Attending: Neurological Surgery | Admitting: Neurological Surgery

## 2016-06-25 DIAGNOSIS — M4712 Other spondylosis with myelopathy, cervical region: Secondary | ICD-10-CM | POA: Diagnosis present

## 2016-06-25 DIAGNOSIS — Z882 Allergy status to sulfonamides status: Secondary | ICD-10-CM

## 2016-06-25 DIAGNOSIS — J45909 Unspecified asthma, uncomplicated: Secondary | ICD-10-CM | POA: Diagnosis present

## 2016-06-25 DIAGNOSIS — M1990 Unspecified osteoarthritis, unspecified site: Secondary | ICD-10-CM | POA: Diagnosis present

## 2016-06-25 DIAGNOSIS — M4713 Other spondylosis with myelopathy, cervicothoracic region: Secondary | ICD-10-CM | POA: Diagnosis present

## 2016-06-25 DIAGNOSIS — M4722 Other spondylosis with radiculopathy, cervical region: Secondary | ICD-10-CM | POA: Diagnosis present

## 2016-06-25 DIAGNOSIS — Z981 Arthrodesis status: Secondary | ICD-10-CM

## 2016-06-25 DIAGNOSIS — Z419 Encounter for procedure for purposes other than remedying health state, unspecified: Secondary | ICD-10-CM

## 2016-06-25 DIAGNOSIS — M4802 Spinal stenosis, cervical region: Principal | ICD-10-CM | POA: Diagnosis present

## 2016-06-25 DIAGNOSIS — Z09 Encounter for follow-up examination after completed treatment for conditions other than malignant neoplasm: Secondary | ICD-10-CM

## 2016-06-25 DIAGNOSIS — M4313 Spondylolisthesis, cervicothoracic region: Secondary | ICD-10-CM | POA: Diagnosis present

## 2016-06-25 DIAGNOSIS — Z800 Family history of malignant neoplasm of digestive organs: Secondary | ICD-10-CM

## 2016-06-25 DIAGNOSIS — Z885 Allergy status to narcotic agent status: Secondary | ICD-10-CM

## 2016-06-25 DIAGNOSIS — M4723 Other spondylosis with radiculopathy, cervicothoracic region: Secondary | ICD-10-CM | POA: Diagnosis present

## 2016-06-25 DIAGNOSIS — K219 Gastro-esophageal reflux disease without esophagitis: Secondary | ICD-10-CM | POA: Diagnosis present

## 2016-06-25 DIAGNOSIS — G8918 Other acute postprocedural pain: Secondary | ICD-10-CM | POA: Diagnosis not present

## 2016-06-25 DIAGNOSIS — Z91018 Allergy to other foods: Secondary | ICD-10-CM

## 2016-06-25 DIAGNOSIS — M5412 Radiculopathy, cervical region: Secondary | ICD-10-CM | POA: Diagnosis present

## 2016-06-25 DIAGNOSIS — M4803 Spinal stenosis, cervicothoracic region: Secondary | ICD-10-CM | POA: Diagnosis present

## 2016-06-25 HISTORY — PX: ANTERIOR CERVICAL DECOMP/DISCECTOMY FUSION: SHX1161

## 2016-06-25 SURGERY — ANTERIOR CERVICAL DECOMPRESSION/DISCECTOMY FUSION 3 LEVEL/HARDWARE REMOVAL
Anesthesia: General | Site: Neck

## 2016-06-25 MED ORDER — LACTATED RINGERS IV SOLN
INTRAVENOUS | Status: DC | PRN
  Administered 2016-06-25 (×2): via INTRAVENOUS

## 2016-06-25 MED ORDER — CYCLOBENZAPRINE HCL 10 MG PO TABS
10.0000 mg | ORAL_TABLET | Freq: Three times a day (TID) | ORAL | Status: DC | PRN
  Administered 2016-06-25 – 2016-06-26 (×2): 10 mg via ORAL
  Filled 2016-06-25 (×2): qty 1

## 2016-06-25 MED ORDER — SUCCINYLCHOLINE CHLORIDE 20 MG/ML IJ SOLN
INTRAMUSCULAR | Status: DC | PRN
  Administered 2016-06-25: 140 mg via INTRAVENOUS

## 2016-06-25 MED ORDER — ONDANSETRON HCL 4 MG/2ML IJ SOLN
INTRAMUSCULAR | Status: AC
  Filled 2016-06-25: qty 2

## 2016-06-25 MED ORDER — SODIUM CHLORIDE 0.9% FLUSH
3.0000 mL | Freq: Two times a day (BID) | INTRAVENOUS | Status: DC
  Administered 2016-06-25: 3 mL via INTRAVENOUS

## 2016-06-25 MED ORDER — ONDANSETRON HCL 4 MG PO TABS: 4.0000 mg | ORAL_TABLET | Freq: Four times a day (QID) | ORAL | Status: DC | PRN

## 2016-06-25 MED ORDER — BISACODYL 10 MG RE SUPP: 10.0000 mg | Freq: Every day | RECTAL | Status: DC | PRN

## 2016-06-25 MED ORDER — FENTANYL CITRATE (PF) 100 MCG/2ML IJ SOLN
INTRAMUSCULAR | Status: DC | PRN
  Administered 2016-06-25: 50 ug via INTRAVENOUS
  Administered 2016-06-25: 150 ug via INTRAVENOUS
  Administered 2016-06-25 (×3): 50 ug via INTRAVENOUS

## 2016-06-25 MED ORDER — PROPOFOL 10 MG/ML IV BOLUS
INTRAVENOUS | Status: DC | PRN
  Administered 2016-06-25: 200 mg via INTRAVENOUS

## 2016-06-25 MED ORDER — CEFAZOLIN SODIUM 1 G IJ SOLR
INTRAMUSCULAR | Status: AC
  Filled 2016-06-25: qty 20

## 2016-06-25 MED ORDER — LABETALOL HCL 5 MG/ML IV SOLN
INTRAVENOUS | Status: AC
  Filled 2016-06-25: qty 4

## 2016-06-25 MED ORDER — SUGAMMADEX SODIUM 200 MG/2ML IV SOLN
INTRAVENOUS | Status: AC
  Filled 2016-06-25: qty 2

## 2016-06-25 MED ORDER — ROCURONIUM BROMIDE 10 MG/ML (PF) SYRINGE
PREFILLED_SYRINGE | INTRAVENOUS | Status: AC
  Filled 2016-06-25: qty 5

## 2016-06-25 MED ORDER — ALBUTEROL SULFATE (2.5 MG/3ML) 0.083% IN NEBU: 3.0000 mL | INHALATION_SOLUTION | Freq: Four times a day (QID) | RESPIRATORY_TRACT | Status: DC | PRN

## 2016-06-25 MED ORDER — LIDOCAINE-EPINEPHRINE 1 %-1:100000 IJ SOLN
INTRAMUSCULAR | Status: AC
  Filled 2016-06-25: qty 1

## 2016-06-25 MED ORDER — MIDAZOLAM HCL 2 MG/2ML IJ SOLN
0.5000 mg | Freq: Once | INTRAMUSCULAR | Status: AC
  Administered 2016-06-25: 0.5 mg via INTRAVENOUS

## 2016-06-25 MED ORDER — MIDAZOLAM HCL 5 MG/5ML IJ SOLN
INTRAMUSCULAR | Status: DC | PRN
  Administered 2016-06-25: 2 mg via INTRAVENOUS

## 2016-06-25 MED ORDER — THROMBIN 5000 UNITS EX SOLR
CUTANEOUS | Status: AC
  Filled 2016-06-25: qty 5000

## 2016-06-25 MED ORDER — BUPIVACAINE HCL (PF) 0.5 % IJ SOLN
INTRAMUSCULAR | Status: AC
  Filled 2016-06-25: qty 30

## 2016-06-25 MED ORDER — PHENYLEPHRINE HCL 10 MG/ML IJ SOLN
INTRAMUSCULAR | Status: DC | PRN
  Administered 2016-06-25 (×2): 80 ug via INTRAVENOUS

## 2016-06-25 MED ORDER — ROCURONIUM BROMIDE 100 MG/10ML IV SOLN
INTRAVENOUS | Status: DC | PRN
  Administered 2016-06-25: 20 mg via INTRAVENOUS
  Administered 2016-06-25: 30 mg via INTRAVENOUS
  Administered 2016-06-25: 50 mg via INTRAVENOUS

## 2016-06-25 MED ORDER — LINACLOTIDE 72 MCG PO CAPS
144.0000 ug | ORAL_CAPSULE | Freq: Every day | ORAL | Status: DC
  Filled 2016-06-25 (×3): qty 2

## 2016-06-25 MED ORDER — DEXAMETHASONE SODIUM PHOSPHATE 10 MG/ML IJ SOLN
INTRAMUSCULAR | Status: DC | PRN
  Administered 2016-06-25: 10 mg via INTRAVENOUS

## 2016-06-25 MED ORDER — SUGAMMADEX SODIUM 200 MG/2ML IV SOLN
INTRAVENOUS | Status: DC | PRN
  Administered 2016-06-25: 200 mg via INTRAVENOUS

## 2016-06-25 MED ORDER — ROSUVASTATIN CALCIUM 5 MG PO TABS: 10.0000 mg | ORAL_TABLET | Freq: Every day | ORAL | Status: DC

## 2016-06-25 MED ORDER — SODIUM CHLORIDE 0.9 % IJ SOLN
INTRAMUSCULAR | Status: AC
  Filled 2016-06-25: qty 10

## 2016-06-25 MED ORDER — MORPHINE SULFATE (PF) 4 MG/ML IV SOLN: 2.0000 mg | INTRAVENOUS | Status: DC | PRN

## 2016-06-25 MED ORDER — HYDROMORPHONE HCL 1 MG/ML IJ SOLN
INTRAMUSCULAR | Status: AC
  Filled 2016-06-25: qty 0.5

## 2016-06-25 MED ORDER — PANTOPRAZOLE SODIUM 20 MG PO TBEC: 20.0000 mg | DELAYED_RELEASE_TABLET | Freq: Every day | ORAL | Status: DC

## 2016-06-25 MED ORDER — CEFAZOLIN SODIUM-DEXTROSE 2-4 GM/100ML-% IV SOLN
2.0000 g | Freq: Three times a day (TID) | INTRAVENOUS | Status: AC
  Administered 2016-06-25 – 2016-06-26 (×2): 2 g via INTRAVENOUS
  Filled 2016-06-25 (×2): qty 100

## 2016-06-25 MED ORDER — MIDAZOLAM HCL 2 MG/2ML IJ SOLN
INTRAMUSCULAR | Status: AC
  Filled 2016-06-25: qty 2

## 2016-06-25 MED ORDER — FLEET ENEMA 7-19 GM/118ML RE ENEM
1.0000 | ENEMA | Freq: Once | RECTAL | Status: DC | PRN
Start: 2016-06-25 — End: 2016-06-27

## 2016-06-25 MED ORDER — POLYVINYL ALCOHOL 1.4 % OP SOLN
1.0000 [drp] | Freq: Every morning | OPHTHALMIC | Status: DC
  Filled 2016-06-25: qty 15

## 2016-06-25 MED ORDER — MENTHOL 3 MG MT LOZG: 1.0000 | LOZENGE | OROMUCOSAL | Status: DC | PRN

## 2016-06-25 MED ORDER — LIDOCAINE 2% (20 MG/ML) 5 ML SYRINGE
INTRAMUSCULAR | Status: AC
  Filled 2016-06-25: qty 5

## 2016-06-25 MED ORDER — THROMBIN 5000 UNITS EX SOLR
CUTANEOUS | Status: AC
  Filled 2016-06-25: qty 10000

## 2016-06-25 MED ORDER — HYDROCODONE-ACETAMINOPHEN 5-325 MG PO TABS
1.0000 | ORAL_TABLET | ORAL | Status: DC | PRN
  Administered 2016-06-25: 1 via ORAL
  Administered 2016-06-25 – 2016-06-26 (×2): 2 via ORAL
  Administered 2016-06-26 (×2): 1 via ORAL
  Administered 2016-06-26 (×2): 2 via ORAL
  Administered 2016-06-27: 1 via ORAL
  Filled 2016-06-25: qty 2
  Filled 2016-06-25 (×4): qty 1
  Filled 2016-06-25 (×3): qty 2

## 2016-06-25 MED ORDER — LABETALOL HCL 5 MG/ML IV SOLN
INTRAVENOUS | Status: DC | PRN
  Administered 2016-06-25 (×2): 5 mg via INTRAVENOUS

## 2016-06-25 MED ORDER — LIDOCAINE-EPINEPHRINE 1 %-1:100000 IJ SOLN
INTRAMUSCULAR | Status: DC | PRN
  Administered 2016-06-25: 3 mL

## 2016-06-25 MED ORDER — BUPIVACAINE HCL (PF) 0.5 % IJ SOLN
INTRAMUSCULAR | Status: DC | PRN
Start: 2016-06-25 — End: 2016-06-25
  Administered 2016-06-25: 3 mL

## 2016-06-25 MED ORDER — FAMOTIDINE 20 MG PO TABS: 20.0000 mg | ORAL_TABLET | Freq: Two times a day (BID) | ORAL | Status: DC

## 2016-06-25 MED ORDER — THROMBIN 20000 UNITS EX SOLR
CUTANEOUS | Status: AC
  Filled 2016-06-25: qty 20000

## 2016-06-25 MED ORDER — POLYETHYLENE GLYCOL 3350 17 G PO PACK: 17.0000 g | PACK | Freq: Every day | ORAL | Status: DC | PRN

## 2016-06-25 MED ORDER — CHLORHEXIDINE GLUCONATE CLOTH 2 % EX PADS: 6.0000 | MEDICATED_PAD | Freq: Once | CUTANEOUS | Status: DC

## 2016-06-25 MED ORDER — ARTIFICIAL TEARS OPHTHALMIC OINT
TOPICAL_OINTMENT | OPHTHALMIC | Status: AC
  Filled 2016-06-25: qty 3.5

## 2016-06-25 MED ORDER — POLYVINYL ALCOHOL 1.4 % OP SOLN: 1.0000 [drp] | Freq: Two times a day (BID) | OPHTHALMIC | Status: DC | PRN

## 2016-06-25 MED ORDER — THROMBIN 5000 UNITS EX SOLR
OROMUCOSAL | Status: DC | PRN
  Administered 2016-06-25 (×3): via TOPICAL

## 2016-06-25 MED ORDER — SODIUM CHLORIDE 0.9 % IR SOLN
Status: DC | PRN
  Administered 2016-06-25: 09:00:00

## 2016-06-25 MED ORDER — DOCUSATE SODIUM 100 MG PO CAPS
100.0000 mg | ORAL_CAPSULE | Freq: Two times a day (BID) | ORAL | Status: DC
  Administered 2016-06-25 – 2016-06-26 (×2): 100 mg via ORAL
  Filled 2016-06-25 (×2): qty 1

## 2016-06-25 MED ORDER — PROPOFOL 10 MG/ML IV BOLUS
INTRAVENOUS | Status: AC
  Filled 2016-06-25: qty 20

## 2016-06-25 MED ORDER — ONDANSETRON HCL 4 MG/2ML IJ SOLN
INTRAMUSCULAR | Status: DC | PRN
  Administered 2016-06-25: 4 mg via INTRAVENOUS

## 2016-06-25 MED ORDER — CARBOXYMETHYLCELLULOSE SODIUM 0.5 % OP SOLN: 1.0000 [drp] | OPHTHALMIC | Status: DC

## 2016-06-25 MED ORDER — LIDOCAINE HCL (CARDIAC) 20 MG/ML IV SOLN
INTRAVENOUS | Status: DC | PRN
  Administered 2016-06-25: 60 mg via INTRAVENOUS
  Administered 2016-06-25: 40 mg via INTRAVENOUS

## 2016-06-25 MED ORDER — CEFAZOLIN SODIUM-DEXTROSE 2-4 GM/100ML-% IV SOLN
2.0000 g | INTRAVENOUS | Status: AC
  Administered 2016-06-25 (×2): 2 g via INTRAVENOUS

## 2016-06-25 MED ORDER — FENTANYL CITRATE (PF) 250 MCG/5ML IJ SOLN
INTRAMUSCULAR | Status: AC
  Filled 2016-06-25: qty 5

## 2016-06-25 MED ORDER — PHENOL 1.4 % MT LIQD
1.0000 | OROMUCOSAL | Status: DC | PRN
  Filled 2016-06-25: qty 177

## 2016-06-25 MED ORDER — SODIUM CHLORIDE 0.9% FLUSH: 3.0000 mL | INTRAVENOUS | Status: DC | PRN

## 2016-06-25 MED ORDER — ACETAMINOPHEN 650 MG RE SUPP: 650.0000 mg | RECTAL | Status: DC | PRN

## 2016-06-25 MED ORDER — DEXAMETHASONE SODIUM PHOSPHATE 10 MG/ML IJ SOLN
INTRAMUSCULAR | Status: AC
  Filled 2016-06-25: qty 1

## 2016-06-25 MED ORDER — ACETAMINOPHEN 325 MG PO TABS: 650.0000 mg | ORAL_TABLET | ORAL | Status: DC | PRN

## 2016-06-25 MED ORDER — PHENYLEPHRINE HCL 10 MG/ML IJ SOLN
INTRAVENOUS | Status: DC | PRN
  Administered 2016-06-25: 25 ug/min via INTRAVENOUS

## 2016-06-25 MED ORDER — SURGIFOAM 100 EX MISC
CUTANEOUS | Status: DC | PRN
  Administered 2016-06-25: 09:00:00 via TOPICAL

## 2016-06-25 MED ORDER — SENNA 8.6 MG PO TABS
1.0000 | ORAL_TABLET | Freq: Two times a day (BID) | ORAL | Status: DC
  Administered 2016-06-25 – 2016-06-26 (×2): 8.6 mg via ORAL
  Filled 2016-06-25 (×2): qty 1

## 2016-06-25 MED ORDER — 0.9 % SODIUM CHLORIDE (POUR BTL) OPTIME
TOPICAL | Status: DC | PRN
  Administered 2016-06-25: 1000 mL

## 2016-06-25 MED ORDER — ALUM & MAG HYDROXIDE-SIMETH 200-200-20 MG/5ML PO SUSP: 30.0000 mL | Freq: Four times a day (QID) | ORAL | Status: DC | PRN

## 2016-06-25 MED ORDER — ONDANSETRON HCL 4 MG/2ML IJ SOLN: 4.0000 mg | Freq: Once | INTRAMUSCULAR | Status: DC | PRN

## 2016-06-25 MED ORDER — MEPERIDINE HCL 25 MG/ML IJ SOLN: 6.2500 mg | INTRAMUSCULAR | Status: DC | PRN

## 2016-06-25 MED ORDER — RISAQUAD PO CAPS
1.0000 | ORAL_CAPSULE | Freq: Every day | ORAL | Status: DC
  Filled 2016-06-25 (×2): qty 1

## 2016-06-25 MED ORDER — PANTOPRAZOLE SODIUM 40 MG PO TBEC: 40.0000 mg | DELAYED_RELEASE_TABLET | Freq: Every day | ORAL | Status: DC

## 2016-06-25 MED ORDER — ONDANSETRON HCL 4 MG/2ML IJ SOLN: 4.0000 mg | Freq: Four times a day (QID) | INTRAMUSCULAR | Status: DC | PRN

## 2016-06-25 MED ORDER — METHOCARBAMOL 500 MG PO TABS
500.0000 mg | ORAL_TABLET | Freq: Four times a day (QID) | ORAL | Status: DC | PRN
  Administered 2016-06-25 – 2016-06-27 (×6): 500 mg via ORAL
  Filled 2016-06-25 (×6): qty 1

## 2016-06-25 MED ORDER — HYDROMORPHONE HCL 1 MG/ML IJ SOLN
0.2500 mg | INTRAMUSCULAR | Status: DC | PRN
  Administered 2016-06-25: 0.5 mg via INTRAVENOUS

## 2016-06-25 SURGICAL SUPPLY — 67 items
ADH SKN CLS APL DERMABOND .7 (GAUZE/BANDAGES/DRESSINGS) ×1
ALLOGRAFT CERV LORD 11X14X5 (Bone Implant) ×3 IMPLANT
BAG DECANTER FOR FLEXI CONT (MISCELLANEOUS) ×2 IMPLANT
BIT DRILL INVIZIA (BIT) IMPLANT
BIT DRILL NEURO 2X3.1 SFT TUCH (MISCELLANEOUS) ×1 IMPLANT
BIT DRILL POWER (BIT) IMPLANT
BLADE SURG 15 STRL LF DISP TIS (BLADE) IMPLANT
BLADE SURG 15 STRL SS (BLADE) ×2
BNDG GAUZE ELAST 4 BULKY (GAUZE/BANDAGES/DRESSINGS) ×2 IMPLANT
BUR BARREL STRAIGHT FLUTE 4.0 (BURR) ×2 IMPLANT
CANISTER SUCT 3000ML PPV (MISCELLANEOUS) ×2 IMPLANT
DECANTER SPIKE VIAL GLASS SM (MISCELLANEOUS) ×1 IMPLANT
DERMABOND ADVANCED (GAUZE/BANDAGES/DRESSINGS) ×1
DERMABOND ADVANCED .7 DNX12 (GAUZE/BANDAGES/DRESSINGS) ×1 IMPLANT
DRAPE LAPAROTOMY 100X72 PEDS (DRAPES) ×2 IMPLANT
DRAPE MICROSCOPE LEICA (MISCELLANEOUS) ×1 IMPLANT
DRAPE POUCH INSTRU U-SHP 10X18 (DRAPES) ×2 IMPLANT
DRILL BIT INVIZIA (BIT) ×2
DRILL BIT POWER (BIT) ×2
DRILL NEURO 2X3.1 SOFT TOUCH (MISCELLANEOUS) ×2
DURAPREP 6ML APPLICATOR 50/CS (WOUND CARE) ×2 IMPLANT
ELECT REM PT RETURN 9FT ADLT (ELECTROSURGICAL) ×2
ELECTRODE REM PT RTRN 9FT ADLT (ELECTROSURGICAL) ×1 IMPLANT
GAUZE SPONGE 4X4 16PLY XRAY LF (GAUZE/BANDAGES/DRESSINGS) IMPLANT
GLOVE BIOGEL PI IND STRL 7.5 (GLOVE) IMPLANT
GLOVE BIOGEL PI IND STRL 8.5 (GLOVE) ×1 IMPLANT
GLOVE BIOGEL PI INDICATOR 7.5 (GLOVE) ×1
GLOVE BIOGEL PI INDICATOR 8.5 (GLOVE) ×1
GLOVE ECLIPSE 6.5 STRL STRAW (GLOVE) ×1 IMPLANT
GLOVE ECLIPSE 8.5 STRL (GLOVE) ×2 IMPLANT
GLOVE EXAM NITRILE LRG STRL (GLOVE) IMPLANT
GLOVE EXAM NITRILE XL STR (GLOVE) IMPLANT
GLOVE EXAM NITRILE XS STR PU (GLOVE) IMPLANT
GLOVE SURG SS PI 7.0 STRL IVOR (GLOVE) ×3 IMPLANT
GOWN STRL REUS W/ TWL LRG LVL3 (GOWN DISPOSABLE) IMPLANT
GOWN STRL REUS W/ TWL XL LVL3 (GOWN DISPOSABLE) ×1 IMPLANT
GOWN STRL REUS W/TWL 2XL LVL3 (GOWN DISPOSABLE) ×2 IMPLANT
GOWN STRL REUS W/TWL LRG LVL3 (GOWN DISPOSABLE) ×4
GOWN STRL REUS W/TWL XL LVL3 (GOWN DISPOSABLE)
HALTER HD/CHIN CERV TRACTION D (MISCELLANEOUS) ×2 IMPLANT
HEMOSTAT POWDER KIT SURGIFOAM (HEMOSTASIS) ×4 IMPLANT
KIT BASIN OR (CUSTOM PROCEDURE TRAY) ×2 IMPLANT
KIT ROOM TURNOVER OR (KITS) ×2 IMPLANT
NDL SPNL 22GX3.5 QUINCKE BK (NEEDLE) ×1 IMPLANT
NEEDLE HYPO 22GX1.5 SAFETY (NEEDLE) ×2 IMPLANT
NEEDLE SPNL 22GX3.5 QUINCKE BK (NEEDLE) ×4 IMPLANT
NS IRRIG 1000ML POUR BTL (IV SOLUTION) ×2 IMPLANT
PACK LAMINECTOMY NEURO (CUSTOM PROCEDURE TRAY) ×2 IMPLANT
PAD ARMBOARD 7.5X6 YLW CONV (MISCELLANEOUS) ×5 IMPLANT
PATTIES SURGICAL 1X1 (DISPOSABLE) ×1 IMPLANT
PLATE ARCHON R 44MM 2 LEVEL (Plate) ×1 IMPLANT
PLATE ARCHON-R 40MM 2L (Plate) ×1 IMPLANT
PLATE INVIZIA 1 LEV 22MM (Plate) ×1 IMPLANT
PUTTY BONE GRAFT KIT 2.5ML (Bone Implant) ×1 IMPLANT
RUBBERBAND STERILE (MISCELLANEOUS) ×2 IMPLANT
SCREW 14MM (Screw) ×8 IMPLANT
SCREW ARCHON ST VAR 4.0X15MM (Screw) ×16 IMPLANT
SCREW ARCHON ST VAR 4.5X15MM (Screw) ×1 IMPLANT
SCREW BN 14X4.2XST VA NS (Screw) IMPLANT
SCREW BN 15X4XST VA NS SPN (Screw) IMPLANT
SPONGE INTESTINAL PEANUT (DISPOSABLE) ×2 IMPLANT
SPONGE SURGIFOAM ABS GEL 100 (HEMOSTASIS) ×1 IMPLANT
STAPLER SKIN PROX WIDE 3.9 (STAPLE) IMPLANT
SUT VIC AB 4-0 RB1 18 (SUTURE) ×4 IMPLANT
TOWEL GREEN STERILE (TOWEL DISPOSABLE) ×2 IMPLANT
TOWEL GREEN STERILE FF (TOWEL DISPOSABLE) ×2 IMPLANT
WATER STERILE IRR 1000ML POUR (IV SOLUTION) ×2 IMPLANT

## 2016-06-25 NOTE — Anesthesia Postprocedure Evaluation (Signed)
Anesthesia Post Note  Patient: [name]  Procedure(s) Performed: Procedure(s) (LRB): CERVICAL THREE- CERVICAL FOUR, CERVICAL SIX- CERVICAL SEVEN, CERVICAL SEVEN- THORACIC ONE  ANTERIOR CERVICAL DECOMPRESSION/DISCECTOMY FUSION, REMOVAL OF ANTERIOR CERVICAL PLATE CERVICAL FOUR- CERVICAL SIX (N/A)  Patient location during evaluation: PACU Anesthesia Type: General Level of consciousness: awake and alert Pain management: pain level controlled Vital Signs Assessment: post-procedure vital signs reviewed and stable Respiratory status: spontaneous breathing, nonlabored ventilation, respiratory function stable and patient connected to nasal cannula oxygen Cardiovascular status: blood pressure returned to baseline and stable Postop Assessment: no signs of nausea or vomiting Anesthetic complications: no       Last Vitals:  Vitals:   06/25/16 1315 06/25/16 1330  BP: 95/62   Pulse: 92 (P) 90  Resp: 12 (P) 12  Temp:  (P) 36.6 C    Last Pain:  Vitals:   06/25/16 1300  TempSrc:   PainSc: Asleep    LLE Motor Response: (P) Responds to commands (06/25/16 1330) LLE Sensation: (P) Full sensation (06/25/16 1330) RLE Motor Response: (P) Responds to commands (06/25/16 1330) RLE Sensation: (P) Full sensation (06/25/16 1330)      Dally Oshel DAVID

## 2016-06-25 NOTE — Transfer of Care (Signed)
Immediate Anesthesia Transfer of Care Note  Patient: [name]  Procedure(s) Performed: Procedure(s): CERVICAL THREE- CERVICAL FOUR, CERVICAL SIX- CERVICAL SEVEN, CERVICAL SEVEN- THORACIC ONE  ANTERIOR CERVICAL DECOMPRESSION/DISCECTOMY FUSION, REMOVAL OF ANTERIOR CERVICAL PLATE CERVICAL FOUR- CERVICAL SIX (N/A)  Patient Location: PACU  Anesthesia Type:General  Level of Consciousness: awake, alert  and oriented  Airway & Oxygen Therapy: Patient Spontanous Breathing and Patient connected to nasal cannula oxygen  Post-op Assessment: Report given to RN, Post -op Vital signs reviewed and stable and Patient moving all extremities X 4  Post vital signs: Reviewed and stable  Last Vitals:  Vitals:   06/25/16 0606  BP: 114/71  Pulse: 77  Resp: 18  Temp: 36.7 C    Last Pain:  Vitals:   06/25/16 0628  TempSrc:   PainSc: 3       Patients Stated Pain Goal: 3 (06/25/16 1610)  Complications: No apparent anesthesia complications

## 2016-06-25 NOTE — Anesthesia Procedure Notes (Addendum)
Procedure Name: Intubation Date/Time: 06/25/2016 7:40 AM Performed by: Kyung Rudd Pre-anesthesia Checklist: Patient identified, Emergency Drugs available, Suction available and Patient being monitored Patient Re-evaluated:Patient Re-evaluated prior to inductionOxygen Delivery Method: Circle system utilized Preoxygenation: Pre-oxygenation with 100% oxygen Intubation Type: IV induction Ventilation: Mask ventilation without difficulty Laryngoscope Size: Glidescope and 3 Tube type: Oral Tube size: 7.0 mm Number of attempts: 1 Airway Equipment and Method: Stylet and LTA kit utilized Placement Confirmation: ETT inserted through vocal cords under direct vision,  positive ETCO2 and breath sounds checked- equal and bilateral Secured at: 21 cm Tube secured with: Tape Dental Injury: Teeth and Oropharynx as per pre-operative assessment  Comments: Electively used Glidescope per Dr. Conrad Benson request. Maintained neutral cervical alignment.

## 2016-06-25 NOTE — Progress Notes (Signed)
Patient ID: [name], female   DOB: 12-30-1960, 56 y.o.   MRN: [id number] Vital signs stable Feels fair Much shoulder pain radiating to traps mobize as tolerated

## 2016-06-25 NOTE — Op Note (Signed)
Date of surgery: 06/25/2016 Preoperative diagnosis: History of arthrodesis C4-5 and C5-6. 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 Postoperative diagnosis: Same Procedure: Exploration of previous arthrodesis and removal of plate see 4 to C6. Anterior cervical decompression C3-4 arthrodesis with structural allograft and anterior plate fixation N5-6. Decompression of C6-7 and C7-T1. Anterior discectomy, arthrodesis with structural allograft, anterior cervical plate fixation O1-H0. Surgeon: Barnett Abu First assistant: Barbaraann Barthel M.D. Anesthesia: Gen. endotracheal Indications: Ms. Ezzard Standing is a 56 year old individual who has had severe spondylitic degenerative changes above and below her previous fusion from C4-C6. She has cord compression at C6-C7 level and she has severe spondylitic degeneration with foraminal stenosis particularly on the right at the C3-C4 level she also has a degenerative spondylolisthesis with foraminal stenosis that is forming at C7-T1. She's been advised regarding the need for surgery.  Procedure: Patient was brought to the operating room supine on a stretcher. After the smooth induction of general endotracheal anesthesia and placement of a Foley catheter her neck was placed in 5 pounds of halter traction in the horseshoe headrest and the neck was prepped with alcohol DuraPrep and draped in a sterile fashion. Transverse incision was made at the area of her previous incision and a small ellipse of skin was removed which was the scar. The dissection was carried down through the platysma and the prevertebral space was easily identified with the soft tissues being mobilized medially to expose the midportion of the old plate. The dissection was then taken along the border of the plate superiorly and inferiorly to expose entirely. The screws were then removed  and the plate was lifted. The area was inspected and was noted to be an area of softening of the bone at the C5-C6 interspace with some thin cancellus bone in this area. Nonetheless the arthrodesis proved to be quite stout and Susette Racer otherwise. Attention was turned to C3-4-1 anterior discectomy was performed after mobilizing longus coli muscle off either side lateral portion of the midline the disc space was entered and was not to be severely spondylitic. Dissection occurred slowly with some significant osteophytes being removed from the lateral recesses particularly on the right side where there is a large uncinate spur. The posterior longitudinal ligament was opened using a combination of a 2 mm Kerrison punch and a high-speed drill. This was done under the operating microscope and the decompression was taken out over the C4 nerve root on the right side and on the left side. The endplates were then smoothed and the interspace was sized for appropriate bone graft. The bone graft felt best to fit into this interspace was a lordotic 5 mm graft. This was placed and countersunk visually. Then she was then turned to C6-7 and C7-T1 similar discectomies were carried out. At C6-C7 there was noted to be significant spondylosis particularly on the left side causing severe canal and foraminal stenosis this was relieved. At the C7-T1 space by foraminal stenosis was encountered but the spondylolisthesis that was noted on the plain x-rays was not as much appreciated. 5 mm inner body spacer graft with slight lordosis was placed here. Then anterior plating was performed with a wide nuvasive plate measuring 44 mm white was contoured to fit over the ventral aspect of the vertebral bodies and secured with 34.2 x 15 mm screws in C6 and 34.2 x 15 mm screws in T1. 2 screws were used and C7. Hemostasis was then  checked and all the soft tissues and was found to be good the retractors were removed the wound was inspected carefully final  radiographs were obtained confirming good position of the hardware and then the platysma was closed with 3-0 Vicryl interrupted fashion for Vicryl was used in the subcutaneous take her skin. Dermabond was used on the skin. Blood loss is estimated 300 mL.

## 2016-06-25 NOTE — H&P (Signed)
[name] is an 56 y.o. female.   Chief Complaint: Neck shoulder and arm pain HPI: [name] is a 56 year old right-handed individual who is had significant neck shoulder and left arm pain. She has been managing this for several years with conservative efforts including physical therapy nonsteroidal anti-inflammatories and exercise but despite maximal efforts she continues to have arm pain and more recently was noted that she had weakness in the left triceps. An MRI was completed in addition to plain x-rays which demonstrated that she has severe spondylosis at C3-C4 with effacement and early compression of the cord and also severe spondylitic changes at C6-7 and C7-T1. She has evidence of compression of her spinal cord with effacement of the left-sided foramen at the C6-C7 level. She's been advised regarding surgical decompression with removal of previously placed plate from Z6-X0 and anterior decompression arthrodesis at C3-4 and again at C6-7 and C7-T1. She is now admitted for this procedure.  Past Medical History:  Diagnosis Date  . Arthritis   . Asthma    asthmatic bronchitis  . Dyspnea    on exertion  . GERD (gastroesophageal reflux disease)   . Heart murmur     Past Surgical History:  Procedure Laterality Date  . ABDOMINAL HYSTERECTOMY    . CERVICAL SPINE SURGERY     4-5 :5-6 cervical fusion sixteen yrs ago    Family History  Problem Relation Age of Onset  . Cancer Mother   . Hypertension Father   . Diabetes Maternal Grandmother   . Diabetes Paternal Grandfather     Pancreatic cancer   Social History:  reports that she has never smoked. She has never used smokeless tobacco. She reports that she drinks alcohol. Her drug history is not on file.  Allergies:  Allergies  Allergen Reactions  . Sulfa Antibiotics Other (See Comments)    Dyspnea and chest tightness  . Other     Ty food (something in the spices) caused bronchospasms happens occasionally  . Percocet  [Oxycodone-Acetaminophen] Hives and Itching    Tolerates acetaminophen    Medications Prior to Admission  Medication Sig Dispense Refill  . aspirin 81 MG tablet Take 81 mg by mouth daily. Takes daily except when bleeding    . carboxymethylcellulose (RETAINE CMC) 0.5 % SOLN Place 1 drop into both eyes See admin instructions. Instills 1 drop in each eye every morning. Repeats twice a day as needed for dry eyes.    . Cholecalciferol (VITAMIN D) 2000 UNITS CAPS Take 2,000 Units by mouth daily.     . Coenzyme Q10 300 MG CAPS Take 300 mg by mouth daily.    . Estradiol (VAGIFEM) 10 MCG TABS vaginal tablet Place 1 tablet vaginally See admin instructions. Takes a bedtime on Monday, Wednesday, Friday.    . lansoprazole (PREVACID) 30 MG capsule Take 30 mg by mouth daily.    Marland Kitchen LINZESS 72 MCG capsule Take 144 mcg by mouth daily.  6  . MAGNESIUM GLYCINATE PLUS PO Take 2 tablets by mouth at bedtime.    . magnesium oxide (MAG-OX) 400 MG tablet Take 400 mg by mouth daily.    . naproxen sodium (ANAPROX) 220 MG tablet Take 440 mg by mouth at bedtime.     . Omega-3 Fatty Acids (OMEGA-3 FISH OIL PO) Take 1 capsule by mouth daily.    Maxwell Caul Bicarbonate (ZEGERID) 20-1100 MG CAPS capsule Take 1 capsule by mouth at bedtime.    . Probiotic Product (ALIGN PO) Take 1 tablet by mouth  daily.     . ranitidine (ZANTAC) 150 MG tablet Take 300 mg by mouth at bedtime.     . riboflavin (VITAMIN B-2) 100 MG TABS tablet Take 100 mg by mouth 2 (two) times daily.    . rosuvastatin (CRESTOR) 10 MG tablet TAKE 1 TABLET BY MOUTH DAILY (Patient taking differently: TAKE 1 TABLET BY MOUTH DAILY AT BEDTIME.) 30 tablet 9  . albuterol (PROVENTIL HFA;VENTOLIN HFA) 108 (90 BASE) MCG/ACT inhaler Inhale 1-2 puffs into the lungs every 6 (six) hours as needed for wheezing or shortness of breath.      No results found for this or any previous visit (from the past 48 hour(s)). No results found.  Review of Systems  HENT: Negative.    Eyes: Negative.   Respiratory: Negative.   Cardiovascular: Negative.   Gastrointestinal: Positive for heartburn.  Musculoskeletal: Positive for neck pain.  Skin: Negative.   Neurological: Positive for weakness.       Chronic neck shoulder and arm pain  Endo/Heme/Allergies: Negative.   Psychiatric/Behavioral: Negative.     Blood pressure 114/71, pulse 77, temperature 98 F (36.7 C), temperature source Oral, resp. rate 18, weight 52.6 kg (116 lb), SpO2 98 %. Physical Exam  Constitutional: She is oriented to person, place, and time. She appears well-developed and well-nourished.  HENT:  Head: Normocephalic and atraumatic.  Eyes: Conjunctivae and EOM are normal. Pupils are equal, round, and reactive to light.  Neck: Neck supple.  Tenderness in supraclavicular fossa is worse on the left than on the right.  Cardiovascular: Normal rate and regular rhythm.   Respiratory: Effort normal and breath sounds normal.  GI: Soft. Bowel sounds are normal.  Neurological: She is alert and oriented to person, place, and time.  Mild weakness in left triceps decreased sensation in the long fingers on both hands. Absent reflux and triceps. On the right  Skin: Skin is warm and dry.  Psychiatric: She has a normal mood and affect. Her behavior is normal. Judgment and thought content normal.     Assessment/Plan Spondylosis with stenosis C3-4 C6-7 C7-T1. Status post arthrodesis C4 C6.  Plan removal of previously placed hardware. Decompression and fusion C3-4, C6-7 C7-T1  Stefani Dama, MD 06/25/2016, 7:26 AM

## 2016-06-25 NOTE — Anesthesia Preprocedure Evaluation (Addendum)
Anesthesia Evaluation  Patient identified by MRN, date of birth, ID band Patient awake    Reviewed: Allergy & Precautions, NPO status , Patient's Chart, lab work & pertinent test results  Airway Mallampati: I  TM Distance: >3 FB Neck ROM: Full    Dental  (+) Teeth Intact, Caps   Pulmonary    Pulmonary exam normal        Cardiovascular Normal cardiovascular exam     Neuro/Psych    GI/Hepatic GERD  Medicated and Controlled,  Endo/Other    Renal/GU      Musculoskeletal   Abdominal   Peds  Hematology   Anesthesia Other Findings   Reproductive/Obstetrics                            Anesthesia Physical Anesthesia Plan  ASA: II  Anesthesia Plan: General   Post-op Pain Management:    Induction: Intravenous  Airway Management Planned: Oral ETT  Additional Equipment:   Intra-op Plan:   Post-operative Plan: Extubation in OR  Informed Consent: I have reviewed the patients History and Physical, chart, labs and discussed the procedure including the risks, benefits and alternatives for the proposed anesthesia with the patient or authorized representative who has indicated his/her understanding and acceptance.   Dental advisory given  Plan Discussed with: CRNA and Surgeon  Anesthesia Plan Comments:        Anesthesia Quick Evaluation

## 2016-06-26 ENCOUNTER — Observation Stay (HOSPITAL_COMMUNITY): Payer: PRIVATE HEALTH INSURANCE

## 2016-06-26 DIAGNOSIS — M4722 Other spondylosis with radiculopathy, cervical region: Secondary | ICD-10-CM | POA: Diagnosis present

## 2016-06-26 DIAGNOSIS — Z882 Allergy status to sulfonamides status: Secondary | ICD-10-CM | POA: Diagnosis not present

## 2016-06-26 DIAGNOSIS — M4712 Other spondylosis with myelopathy, cervical region: Secondary | ICD-10-CM | POA: Diagnosis present

## 2016-06-26 DIAGNOSIS — M4713 Other spondylosis with myelopathy, cervicothoracic region: Secondary | ICD-10-CM | POA: Diagnosis present

## 2016-06-26 DIAGNOSIS — Z885 Allergy status to narcotic agent status: Secondary | ICD-10-CM | POA: Diagnosis not present

## 2016-06-26 DIAGNOSIS — K219 Gastro-esophageal reflux disease without esophagitis: Secondary | ICD-10-CM | POA: Diagnosis present

## 2016-06-26 DIAGNOSIS — J45909 Unspecified asthma, uncomplicated: Secondary | ICD-10-CM | POA: Diagnosis present

## 2016-06-26 DIAGNOSIS — M4723 Other spondylosis with radiculopathy, cervicothoracic region: Secondary | ICD-10-CM | POA: Diagnosis present

## 2016-06-26 DIAGNOSIS — Z981 Arthrodesis status: Secondary | ICD-10-CM | POA: Diagnosis not present

## 2016-06-26 DIAGNOSIS — M1990 Unspecified osteoarthritis, unspecified site: Secondary | ICD-10-CM | POA: Diagnosis present

## 2016-06-26 DIAGNOSIS — Z800 Family history of malignant neoplasm of digestive organs: Secondary | ICD-10-CM | POA: Diagnosis not present

## 2016-06-26 DIAGNOSIS — M4313 Spondylolisthesis, cervicothoracic region: Secondary | ICD-10-CM | POA: Diagnosis present

## 2016-06-26 DIAGNOSIS — G8918 Other acute postprocedural pain: Secondary | ICD-10-CM | POA: Diagnosis not present

## 2016-06-26 DIAGNOSIS — M4802 Spinal stenosis, cervical region: Secondary | ICD-10-CM | POA: Diagnosis present

## 2016-06-26 DIAGNOSIS — Z91018 Allergy to other foods: Secondary | ICD-10-CM | POA: Diagnosis not present

## 2016-06-26 DIAGNOSIS — M4803 Spinal stenosis, cervicothoracic region: Secondary | ICD-10-CM | POA: Diagnosis present

## 2016-06-26 MED ORDER — HYDROCODONE-ACETAMINOPHEN 5-325 MG PO TABS: 1.0000 | ORAL_TABLET | ORAL | 0 refills | Status: DC | PRN

## 2016-06-26 MED ORDER — CYCLOBENZAPRINE HCL 10 MG PO TABS: 10.0000 mg | ORAL_TABLET | Freq: Three times a day (TID) | ORAL | 3 refills | Status: DC | PRN

## 2016-06-26 MED ORDER — NYSTATIN 100000 UNIT/ML MT SUSP: 5.0000 mL | Freq: Four times a day (QID) | OROMUCOSAL | 0 refills | Status: DC

## 2016-06-26 MED ORDER — DEXAMETHASONE 4 MG PO TABS
4.0000 mg | ORAL_TABLET | Freq: Four times a day (QID) | ORAL | Status: DC
  Administered 2016-06-26: 4 mg via ORAL
  Filled 2016-06-26: qty 1

## 2016-06-26 MED ORDER — DEXAMETHASONE 4 MG PO TABS
2.0000 mg | ORAL_TABLET | Freq: Four times a day (QID) | ORAL | Status: DC
  Administered 2016-06-26 – 2016-06-27 (×3): 2 mg via ORAL
  Filled 2016-06-26 (×3): qty 1

## 2016-06-26 MED ORDER — METHOCARBAMOL 500 MG PO TABS: 500.0000 mg | ORAL_TABLET | Freq: Four times a day (QID) | ORAL | 3 refills | Status: DC | PRN

## 2016-06-26 MED ORDER — RISAQUAD PO CAPS
1.0000 | ORAL_CAPSULE | Freq: Every day | ORAL | Status: DC
  Filled 2016-06-26 (×2): qty 1

## 2016-06-26 MED ORDER — DEXAMETHASONE 1 MG PO TABS: ORAL_TABLET | ORAL | 0 refills | Status: DC

## 2016-06-26 MED FILL — Thrombin For Soln 5000 Unit: CUTANEOUS | Qty: 5000 | Status: AC

## 2016-06-26 NOTE — Progress Notes (Signed)
Patient ID: [name], female   DOB: [birth year], 56 y.o.   MRN: [id number] Vital signs stable Motor function ok Considerable neck spasms interscapular pain

## 2016-06-27 ENCOUNTER — Encounter (HOSPITAL_COMMUNITY): Payer: Self-pay | Admitting: Neurological Surgery

## 2016-06-27 NOTE — Progress Notes (Signed)
Patient alert and oriented, mae's well, voiding adequate amount of urine, swallowing without difficulty, no c/o pain at time of discharge. Patient discharged home with Spouse. Script and discharged instructions given to patient. Patient and spouse stated understanding of instructions given. Patient has an appointment with Dr. Danielle DessElsner.

## 2016-07-02 NOTE — Discharge Summary (Signed)
Physician Discharge Summary  Patient ID: [name] MRN: [id number] DOB/AGE: Dec 11, 1960 56 y.o.  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 Status post arthrodesis C4-C6.  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 Status post arthrodesis C4-C6. Cervical radiculopathy. Active Problems:   Cervical radiculopathy   Discharged Condition: fair  Hospital Course: Patient was omitted to undergo surgical decompression at C3-4 C6-7 C7-T1. She tolerated surgery well however she had considerable postoperative Asthma and pain. Required extensive medication to bring under reasonable control. Patient is ambulatory motor function appeared intact postoperatively.  Consults: None  Significant Diagnostic Studies: None  Treatments: surgery: Anterior cervical decompression C3-4 arthrodesis with structural allograft. Removal of plate from W0-J8. Anterior cervical decompression C6-7 and C7-T1 anterior plate fixation from C6 to T1.  Discharge Exam: Blood pressure 101/71, pulse 95, temperature 98.7 F (37.1 C), temperature source Oral, resp. rate 18, weight 52.6 kg (116 lb), SpO2 95 %. Incision is clean and dry. Motor function appears intact in the upper extremities and lower extremities station and gait are intact  Disposition: 01-Home or Self Care  Discharge Instructions    Call MD for:  redness, tenderness, or signs of infection (pain, swelling, redness, odor or green/yellow discharge around incision site)    Complete by:  As directed    Call MD for:  severe uncontrolled pain    Complete by:  As directed    Call MD for:  temperature >100.4    Complete by:  As directed    Diet - low sodium heart healthy    Complete by:  As directed    Discharge instructions    Complete by:  As directed    Okay to shower. Do not apply salves or appointments to incision. No heavy lifting  with the upper extremities greater than 15 pounds. May resume driving when not requiring pain medication and patient feels comfortable with doing so.   Incentive spirometry RT    Complete by:  As directed    Increase activity slowly    Complete by:  As directed      Allergies as of 06/27/2016      Reactions   Sulfa Antibiotics Other (See Comments)   Dyspnea and chest tightness   Other    Ty food (something in the spices) caused bronchospasms happens occasionally   Percocet [oxycodone-acetaminophen] Hives, Itching   Tolerates acetaminophen      Medication List    TAKE these medications   albuterol 108 (90 Base) MCG/ACT inhaler Commonly known as:  PROVENTIL HFA;VENTOLIN HFA Inhale 1-2 puffs into the lungs every 6 (six) hours as needed for wheezing or shortness of breath.   ALIGN PO Take 1 tablet by mouth daily.   aspirin 81 MG tablet Take 81 mg by mouth daily. Takes daily except when bleeding   Coenzyme Q10 300 MG Caps Take 300 mg by mouth daily.   cyclobenzaprine 10 MG tablet Commonly known as:  FLEXERIL Take 1 tablet (10 mg total) by mouth 3 (three) times daily as needed for muscle spasms.   dexamethasone 1 MG tablet Commonly known as:  DECADRON 2 tablets twice daily for 2 days, one tablet twice daily for 2 days, one tablet daily for 2 days.   HYDROcodone-acetaminophen 5-325 MG tablet Commonly known as:  NORCO/VICODIN Take 1-2 tablets by mouth every 4 (four) hours as needed (breakthrough pain).   lansoprazole 30 MG capsule Commonly known as:  PREVACID Take 30  mg by mouth daily.   LINZESS 72 MCG capsule Generic drug:  linaclotide Take 144 mcg by mouth daily.   MAGNESIUM GLYCINATE PLUS PO Take 2 tablets by mouth at bedtime.   magnesium oxide 400 MG tablet Commonly known as:  MAG-OX Take 400 mg by mouth daily.   methocarbamol 500 MG tablet Commonly known as:  ROBAXIN Take 1 tablet (500 mg total) by mouth every 6 (six) hours as needed for muscle spasms.    naproxen sodium 220 MG tablet Commonly known as:  ANAPROX Take 440 mg by mouth at bedtime.   nystatin 100000 UNIT/ML suspension Commonly known as:  MYCOSTATIN Take 5 mLs (500,000 Units total) by mouth 4 (four) times daily.   OMEGA-3 FISH OIL PO Take 1 capsule by mouth daily.   Omeprazole-Sodium Bicarbonate 20-1100 MG Caps capsule Commonly known as:  ZEGERID Take 1 capsule by mouth at bedtime.   ranitidine 150 MG tablet Commonly known as:  ZANTAC Take 300 mg by mouth at bedtime.   RETAINE CMC 0.5 % Soln Generic drug:  carboxymethylcellulose Place 1 drop into both eyes See admin instructions. Instills 1 drop in each eye every morning. Repeats twice a day as needed for dry eyes.   riboflavin 100 MG Tabs tablet Commonly known as:  VITAMIN B-2 Take 100 mg by mouth 2 (two) times daily.   rosuvastatin 10 MG tablet Commonly known as:  CRESTOR TAKE 1 TABLET BY MOUTH DAILY What changed:  See the new instructions.   VAGIFEM 10 MCG Tabs vaginal tablet Generic drug:  Estradiol Place 1 tablet vaginally See admin instructions. Takes a bedtime on Monday, Wednesday, Friday.   Vitamin D 2000 units Caps Take 2,000 Units by mouth daily.        SignedStefani Dama: Dioselina Brumbaugh J 07/02/2016, 11:14 AM

## 2016-09-04 ENCOUNTER — Telehealth: Payer: Self-pay | Admitting: Cardiovascular Disease

## 2016-09-04 DIAGNOSIS — E785 Hyperlipidemia, unspecified: Secondary | ICD-10-CM

## 2016-09-04 NOTE — Telephone Encounter (Signed)
Returned call to patient, requesting lab orders to get labs drawn prior to appt with Dr. Tresa EndoKelly in October.  Orders placed , patient aware to have fasting as well as lab hours.  Patient verbalized understanding.

## 2016-09-04 NOTE — Telephone Encounter (Signed)
New message    Pt verbalized that she wants an order for labs to have done before coming to see Dr.Kelly

## 2016-12-12 ENCOUNTER — Telehealth: Payer: Self-pay | Admitting: Cardiovascular Disease

## 2016-12-12 NOTE — Telephone Encounter (Signed)
Spoke with patient and she was just wanting to know if labs received from PCP Advised patient nothing in system. Stated she would go by PCP and get copy for upcoming appointment

## 2016-12-12 NOTE — Telephone Encounter (Signed)
New message   Patient wants to know if she needs labs prior to appt next week.  Please call

## 2016-12-17 ENCOUNTER — Encounter: Payer: Self-pay | Admitting: Cardiovascular Disease

## 2016-12-17 ENCOUNTER — Ambulatory Visit (INDEPENDENT_AMBULATORY_CARE_PROVIDER_SITE_OTHER): Payer: PRIVATE HEALTH INSURANCE | Admitting: Cardiovascular Disease

## 2016-12-17 VITALS — BP 98/62 | HR 78 | Ht 61.5 in | Wt 106.0 lb

## 2016-12-17 DIAGNOSIS — K219 Gastro-esophageal reflux disease without esophagitis: Secondary | ICD-10-CM

## 2016-12-17 DIAGNOSIS — E785 Hyperlipidemia, unspecified: Secondary | ICD-10-CM

## 2016-12-17 DIAGNOSIS — R0609 Other forms of dyspnea: Secondary | ICD-10-CM

## 2016-12-17 NOTE — Progress Notes (Signed)
Patient ID: [name], female   DOB: 1961/02/18, 56 y.o.   MRN: [id number]    Primary MD: Dr. Kelton Mathews  HPI:  [name] is a 56 y.o. female  who presents for a one year follow-up cardiology evaluation.  She is the wife of Dr. Jovita Mathews.   Ms [name] denies any prior cardiac history.  She has remained fairly active and walks regularly with her husband.  In 2015 she noticed a definite change with development of more shortness of breath with activity and experienced a vague sensation of chest tightness which can occur at night.  She does not believe this is due to asthma.  She also has noticed some occasional palpitations.  She saw Dr. Laurann Mathews on 08/04/2013 and saw me for initial cardiology evaluation shortly thereafter.  A 2-D echo Doppler study on 09/01/2013 showed an ejection fraction of 55-60%.  She had normal wall motion without regional wall motion abnormalities.  Doppler parameters are consistent with mild grade 1 diastolic dysfunction.  There was trivial aortic insufficiency.  She underwent a nuclear perfusion study and exercise for 10 minutes and 21 seconds.  She stopped secondary to fatigue.  There were no diagnostic ST changes.  It was reported that she had a mild hypertensive response to exercise.  There was a mild defect  in the anteroapical region and it was felt most likely that this was breast attenuation artifact.  However, a minimal region of ischemia could not be completely excluded.  She had normal contractility with an ejection fraction of 66%.  When I saw her I recommended initiation of statin therapy in light of her lipid panel done by Dr. Coral Mathews in June 2015 which showed a total cholesterol 255, triglycerides 52, HDL 79, but an LDL of 166.  She has been taking Crestor 10 mg.  She did have subsequent blood work, which showed normal LFTs, and chemistry.  Glucose was minimally increased at 107.  Hemoglobin 11.9, hematocrit 36.1.   She has been tolerating  Crestor for hyperlipidemia.  2 months after initiation of therapy, an NMR profile showed significant benefit with her total cholesterol being reduced from 255 to169, her LDL from 166 to 75, and her LDL particle number was excellent at 857.  Insulin resistance score was excellent.  Her HDL was 83 and triglycerides were 53.  Initially she did feel mild myalgias but these have resolved.  He denies any recent chest pain.   Since I last saw her, she has continued to do well.  She does classes and tolerates this well without shortness of breath.  At times she does note some mild shortness of breath with a pill steep climbing.  She recently returned to [place] this year and actually felt her breathing was better.  When compared to her prior trip with reference to shortness of breath.  Lab work in 2016 revealed her to be  minimally anemic with a hemoglobin of 11.6 and hematocrit 35.5.  Lipid studies remained excellent on Crestor with a total cholesterol 161, triglycerides 42, HDL 85, and LDL 68.  Her fasting glucose had improved and was now normal at 96.  Since I last saw her, she underwent extensive back surgery.  Previously 16 years ago.  She had undergone fusion of C4-5 and C5-6.  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  She tolerated neurosurgery well without card of vascular compromise.  This past summer she underwent a 3 week trip to [place] where she was ambulating well.  She denied any chest pain or shortness of breath.  Specifically, there was no significant exertional angina.  She had blood work from her primary physician in August 2018.  Total cholesterol was 142, triglycerides 51, HDL 67, LDL 65, and non-HDL 75.  Chemistry profile was normal except glucose  was minimally increased at 109.  Medical history is also notable for significant esophageal reflux for which she takes concomitant therapy with Zantac, Zegerid, and Prevacid.  She also has intermittent, irritable bowel symptoms, and a history of asthma.  Prior Surgical history is notable for hysterectomy in 1995 and cervical discectomy in 2004.  Allergies  Allergen Reactions  . Sulfa Antibiotics Other (See Comments)    Dyspnea and chest tightness  . Other     [place] food (something in the spices) caused bronchospasms happens occasionally  . Percocet [Oxycodone-Acetaminophen] Hives and Itching    Tolerates acetaminophen    Current Outpatient Prescriptions  Medication Sig Dispense Refill  . albuterol (PROVENTIL HFA;VENTOLIN HFA) 108 (90 BASE) MCG/ACT inhaler Inhale 1-2 puffs into the lungs every 6 (six) hours as needed for wheezing or shortness of breath.    Marland Kitchen aspirin 81 MG tablet Take 81 mg by mouth daily. Takes daily except when bleeding    . carboxymethylcellulose (RETAINE CMC) 0.5 % SOLN Place 1 drop into both eyes See admin instructions. Instills 1 drop in each eye every morning. Repeats twice a day as needed for dry eyes.    . Cholecalciferol (VITAMIN D) 2000 UNITS CAPS Take 2,000 Units by mouth daily.     . Coenzyme Q10 300 MG CAPS Take 300 mg by mouth daily.    . Estradiol (VAGIFEM) 10 MCG TABS vaginal tablet Place 1 tablet vaginally See admin instructions. Takes a bedtime on Monday, Wednesday, Friday.    . lansoprazole (PREVACID) 30 MG capsule Take 30 mg by mouth daily.    Marland Kitchen LINZESS 145 MCG CAPS capsule Take 145 mcg by mouth daily.   6  . MAGNESIUM GLYCINATE PLUS PO Take 2 tablets by mouth at bedtime.    . magnesium oxide (MAG-OX) 400 MG tablet Take 400 mg by mouth daily.    . naproxen sodium (ANAPROX) 220 MG tablet Take 440 mg by mouth at bedtime.     . Omega-3 Fatty Acids (OMEGA-3 FISH OIL PO) Take 1 capsule by mouth daily.    [name] Bicarbonate (ZEGERID) 20-1100 MG  CAPS capsule Take 1 capsule by mouth at bedtime.    . Probiotic Product (ALIGN PO) Take 1 tablet by mouth daily.     . ranitidine (ZANTAC) 150 MG tablet Take 300 mg by mouth at bedtime.     . riboflavin (VITAMIN B-2) 100 MG TABS tablet Take 100 mg by mouth 2 (two) times daily.    . rosuvastatin (CRESTOR) 10 MG tablet TAKE 1 TABLET BY MOUTH DAILY (Patient taking differently: TAKE 1 TABLET BY MOUTH DAILY AT BEDTIME.) 30 tablet 9   No current facility-administered medications for this visit.    Socially, she is married to Dr. Jovita Mathews for 34 years.  She has 2 children who are both lawyers and live in [place].  She previously was as a Management consultant.  There is no tobacco history.  She rarely drinks alcohol.  She does  exercise and performs one to 3 times per week.  She does walk with her husband.  Family History  Problem Relation Age of Onset  . Cancer Mother   . Hypertension Father   . Diabetes Maternal Grandmother   . Diabetes Paternal Grandfather        Pancreatic cancer   Family history is also notable in that her father died with Parkinson's disease.  ROS General: Negative; No fevers, chills, or night sweats HEENT: Negative; No changes in vision or hearing, sinus congestion, difficulty swallowing Pulmonary: Positive for asthma; No cough, wheezing,hemoptysis Cardiovascular:  See HPI; no recent exertional dyspnea or chest pain.  No palpitations. GI: Positive for esophageal reflux;  No nausea, vomiting, diarrhea, or abdominal pain GU: Positive for hysterectomy No dysuria, hematuria, or difficulty voiding Musculoskeletal: Negative; no myalgias, joint pain, or weakness Hematologic/Oncologic: Negative; no easy bruising, bleeding Endocrine: Negative; no heat/cold intolerance; no diabetes Neuro: Status post neurosurgery for spinal low cysts/stenosis with myelopathy of her cervical vertebrae Skin: Negative; No rashes or skin lesions Psychiatric: Negative; No behavioral problems,  depression Sleep: Negative; No daytime sleepiness, hypersomnolence, bruxism, restless legs, hypnogagnic hallucinations Other comprehensive 14 point system review is negative   Physical Exam BP 98/62   Pulse 78   Ht 5' 1.5" (1.562 m)   Wt 106 lb (48.1 kg)   BMI 19.70 kg/m    Repeat blood pressure by me was 110/64 supine and 102/62 standing  Wt Readings from Last 3 Encounters:  12/17/16 106 lb (48.1 kg)  06/25/16 116 lb (52.6 kg)  06/17/16 116 lb 9.6 oz (52.9 kg)   General: Alert, oriented, no distress.  Skin: normal turgor, no rashes, warm and dry HEENT: Normocephalic, atraumatic. Pupils equal round and reactive to light; sclera anicteric; extraocular muscles intact;  Nose without nasal septal hypertrophy Mouth/Parynx benign; Mallinpatti scale 2 Neck: No JVD, no carotid bruits; normal carotid upstroke , well-healed surgical scars Lungs: clear to ausculatation and percussion; no wheezing or rales Chest wall: without tenderness to palpitation Heart: PMI not displaced, RRR, s1 s2 normal, 1/6 systolic murmur, no diastolic murmur, no rubs, gallops, thrills, or heaves Abdomen: soft, nontender; no hepatosplenomehaly, BS+; abdominal aorta nontender and not dilated by palpation. Back: no CVA tenderness Pulses 2+ Musculoskeletal: full range of motion, normal strength, no joint deformities Extremities: no clubbing, cyanosis or edema, Homan's sign negative  Neurologic: grossly nonfocal; Cranial nerves grossly wnl Psychologic: Normal mood and affect  ECG (independently read by me): Normal sinus rhythm at 78 bpm.  No ectopy.  Normal intervals.  No ST segment changes.  October 2017 ECG (independently read by me): Normal sinus rhythm at 62 bpm.  Normal intervals.  No ST segment changes.  September 2016 ECG (independently read by me): Normal sinus rhythm at 61 bpm.  Normal intervals.  No ST segment changes.  February 2016 ECG (independently read by me) : Normal sinus rhythm at 69 bpm.  No  significant ST segment changes.  Normal intervals.    08/16/13 ECG (independently read by me): Normal sinus rhythm at 65 beats per minute.  PR interval normal at 124 ms.  QT 0.49 ms.  LABS: BMP Latest Ref Rng & Units 06/17/2016 10/20/2014 10/04/2013  Glucose 65 - 99 mg/dL 107(H) 96 107(H)  BUN 6 - 20 mg/dL '19 17 21  ' Creatinine 0.44 - 1.00 mg/dL 0.78 0.71 0.70  Sodium 135 - 145 mmol/L 138 140 143  Potassium 3.5 - 5.1 mmol/L 4.0 4.5 5.1  Chloride 101 - 111 mmol/L 104  104 103  CO2 22 - 32 mmol/L '26 30 28  ' Calcium 8.9 - 10.3 mg/dL 9.5 9.3 9.6   Hepatic Function Latest Ref Rng & Units 10/20/2014 10/04/2013 03/20/2010  Total Protein 6.1 - 8.1 g/dL 6.8 7.3 7.7  Albumin 3.6 - 5.1 g/dL 4.0 3.8 4.0  AST 10 - 35 U/L '16 15 25  ' ALT 6 - 29 U/L '11 18 20  ' Alk Phosphatase 33 - 130 U/L 42 39 58  Total Bilirubin 0.2 - 1.2 mg/dL 0.5 0.4 0.6   CBC Latest Ref Rng & Units 06/17/2016 10/20/2014 10/04/2013  WBC 4.0 - 10.5 K/uL 7.4 5.5 6.6  Hemoglobin 12.0 - 15.0 g/dL 12.1 11.6(L) 11.9(L)  Hematocrit 36.0 - 46.0 % 37.4 35.5(L) 36.1  Platelets 150 - 400 K/uL 270 256 254   Lab Results  Component Value Date   MCV 88.2 06/17/2016   MCV 89.4 10/20/2014   MCV 91.4 10/04/2013   Lab Results  Component Value Date   TSH 2.466 10/20/2014  No results found for: HGBA1C  Lipid Panel     Component Value Date/Time   CHOL 161 10/20/2014 0933   CHOL 169 10/08/2013 0911   TRIG 42 10/20/2014 0933   TRIG 53 10/08/2013 0911   HDL 85 10/20/2014 0933   HDL 83 10/08/2013 0911   CHOLHDL 1.9 10/20/2014 0933   VLDL 8 10/20/2014 0933   LDLCALC 68 10/20/2014 0933   LDLCALC 75 10/08/2013 0911    NMR Lipid Panel  10/08/2013:   Ref Range 63moago    LDL Particle Number <1000 nmol/L 857   Comments: Reference Range  Low < 1000  Moderate 1000 - 1299  Borderline-High 1300 - 1599  High 1600 - 2000  Very High > 2000    LDL (calc) <100 mg/dL 75   Comments: LDL-C is inaccurate if patient is nonfasting. Reference  Range  Optimal < 100  Near/Above Optimal 100 - 129  Borderline High 130 - 159  High 160 - 189  Very High >= 190    HDL-C >=40 mg/dL 83   Triglycerides <150 mg/dL 53   Cholesterol, Total <200 mg/dL 169   HDL Particle Number >=30.5 umol/L 34.3   Large HDL-P >=4.8 umol/L 18.5   Large VLDL-P <=2.7 nmol/L < 0.8   Small LDL Particle Number <=527 nmol/L 96   LDL Size >20.5 nm 21.3   HDL Size >=9.2 nm 10.5   VLDL Size nm See Note   Comments: VLDL concentration too low to allow determination of VLDL Size. Low VLDL concentration contributes minimally to the LP-IR score.   LP-IR Score <= 45  < 25        [place] [place].  BUN 23, creatinine 0.82.  Normal LFTs.  Normal electrolytes.  Lipid studies as noted above in history of present illness.  RADIOLOGY: No results found.   IMPRESSION:  1. Hyperlipidemia, unspecified hyperlipidemia type   2. Exertional dyspnea   3. Gastroesophageal reflux disease without esophagitis     ASSESSMENT AND PLAN: Ms  EPinki Rottmanis a 56year old female who has a history of significant GERD and takes Zegerid as well as Prevacid and ranitidine.  [place]y, she developed significant, morning hoarseness as result of gastric reflux.  She has a history of mild asthma, for which he takes Proventil on an as-needed basis.  Remotely she had noticed  exertional shortness of breath, particularly with steep hills.  This has essentially resolved and now she is exercising  regularly without any dyspnea.  She admits to rare episode of isolated palpitations which occur every 4-5 months and last for seconds.  In 2015 she underwent an echo and Myoview study. She has firm breast tissue and I suspect the apical anterolateral defect is most likely due to breast induced artifact.  She had normal contractility and normal systolic thickening of all myocardial segments.  Her ejection fraction is normal.  .  There was mild  mild diastolic dysfunction, which may have contributed to some of her prior exertional  dyspnea.  She continues to be on Crestor for hyperlipidemia with significant improvement from her baseline elevation.  I reviewed her most recent laboratory.  LDL is excellent at 65.  She seems to tolerate the current regimen and rarely may experience an occasional arthralgia and if this were to develop.  She may hold Crestor for couple of days and then resumes and does well.  She tolerated her neurosurgery without card of vascular compromise.  She also not of any significant symptom apology on her three-week excursion to [place].  She will continue with her current medical regimen.  She tells me she sees Dr. Laurann Mathews on a yearly basis.  As long as she is stable, I will see her in 2 years for cardiology follow-up evaluation.   Troy Sine, MD, [facility] 12/17/2016 2:32 PM

## 2016-12-17 NOTE — Patient Instructions (Signed)
Medication Instructions:  Continue current medications  If you need a refill on your cardiac medications before your next appointment, please call your pharmacy.  Labwork: None Ordered   Testing/Procedures: None Ordered  Follow-Up: Your physician wants you to follow-up in: 2 Years. You should receive a reminder letter in the mail two months in advance. If you do not receive a letter, please call our office [phone].     Thank you for choosing [facility]!!       

## 2017-06-23 ENCOUNTER — Other Ambulatory Visit: Payer: Self-pay | Admitting: Obstetrics and Gynecology

## 2017-06-23 DIAGNOSIS — Z1231 Encounter for screening mammogram for malignant neoplasm of breast: Secondary | ICD-10-CM

## 2017-07-15 ENCOUNTER — Ambulatory Visit
Admission: RE | Admit: 2017-07-15 | Discharge: 2017-07-15 | Disposition: A | Discharge status: Home / Self Care | Payer: PRIVATE HEALTH INSURANCE | Source: Ambulatory Visit | Attending: Obstetrics and Gynecology | Admitting: Obstetrics and Gynecology

## 2017-07-15 DIAGNOSIS — Z1231 Encounter for screening mammogram for malignant neoplasm of breast: Secondary | ICD-10-CM

## 2018-06-11 ENCOUNTER — Other Ambulatory Visit: Payer: Self-pay | Admitting: Neurological Surgery

## 2018-06-11 DIAGNOSIS — M4712 Other spondylosis with myelopathy, cervical region: Secondary | ICD-10-CM

## 2018-06-12 ENCOUNTER — Other Ambulatory Visit: Payer: Self-pay | Admitting: Neurological Surgery

## 2018-06-16 ENCOUNTER — Other Ambulatory Visit: Payer: Self-pay

## 2018-06-16 ENCOUNTER — Ambulatory Visit
Admission: RE | Admit: 2018-06-16 | Discharge: 2018-06-16 | Disposition: A | Discharge status: Home / Self Care | Payer: PRIVATE HEALTH INSURANCE | Source: Ambulatory Visit | Attending: Neurological Surgery | Admitting: Neurological Surgery

## 2018-06-16 DIAGNOSIS — M4712 Other spondylosis with myelopathy, cervical region: Secondary | ICD-10-CM

## 2018-08-21 ENCOUNTER — Other Ambulatory Visit: Payer: Self-pay | Admitting: Obstetrics and Gynecology

## 2018-08-21 DIAGNOSIS — Z1231 Encounter for screening mammogram for malignant neoplasm of breast: Secondary | ICD-10-CM

## 2018-10-05 ENCOUNTER — Other Ambulatory Visit: Payer: Self-pay

## 2018-10-05 ENCOUNTER — Ambulatory Visit
Admission: RE | Admit: 2018-10-05 | Discharge: 2018-10-05 | Disposition: A | Discharge status: Home / Self Care | Payer: PRIVATE HEALTH INSURANCE | Source: Ambulatory Visit | Attending: Obstetrics and Gynecology | Admitting: Obstetrics and Gynecology

## 2018-10-05 DIAGNOSIS — Z1231 Encounter for screening mammogram for malignant neoplasm of breast: Secondary | ICD-10-CM

## 2019-05-13 ENCOUNTER — Ambulatory Visit: Payer: PRIVATE HEALTH INSURANCE | Admitting: Physician Assistant

## 2019-05-13 ENCOUNTER — Encounter: Payer: Self-pay | Admitting: Physician Assistant

## 2019-05-13 ENCOUNTER — Other Ambulatory Visit: Payer: Self-pay

## 2019-05-13 VITALS — BP 120/82 | HR 64 | Temp 98.1°F | Ht 62.5 in | Wt 105.0 lb

## 2019-05-13 DIAGNOSIS — E785 Hyperlipidemia, unspecified: Secondary | ICD-10-CM | POA: Diagnosis not present

## 2019-05-13 DIAGNOSIS — R0609 Other forms of dyspnea: Secondary | ICD-10-CM

## 2019-05-13 DIAGNOSIS — R0600 Dyspnea, unspecified: Secondary | ICD-10-CM | POA: Diagnosis not present

## 2019-05-13 NOTE — Progress Notes (Signed)
Cardiology Office Note:    Date:  05/15/2019   ID:  [name], DOB [birth year], MRN [id number]  PCP:  Maurice Small, MD  Cardiologist:  Nicki Guadalajara, MD  Electrophysiologist:  None   Referring MD: Maurice Small, MD   Chief Complaint  Patient presents with  . Follow-up    seen for Dr. Tresa Endo.    History of Present Illness:    [name] is a 58 y.o. female with a hx of heart murmur and asthma.  She is a wife of [name].  She does not have any prior cardiac history.  In 2015, she noticed increasing dyspnea on exertion and a vague sensation of chest tightness. Echocardiogram obtained on 09/01/2013 showed EF 55 to 60%, normal wall motion, grade 1 DD, trivial aortic insufficiency.  She underwent treadmill Myoview and was able to exercise for 10 minutes and 21 seconds, there was no diagnostic ST changes, she had mild hypertensive response to exercise, there was mild defect in the anteroapical region consistent with breast attenuation artifact, however a minimal regional ischemia cannot be completely excluded.  EF was 66%.  When she was seen by Dr. Tresa Endo, she was started on statin therapy.  Based on outside lab work, her cholesterol seems to be very well controlled.  Last lipid panel obtained in August 2020 demonstrated total cholesterol 151, HDL 73, LDL 68, triglyceride 56.  I recommended continue on the current statin therapy.  She is also on co-Q10 and vitamin D along with aspirin.  She does have dyspnea on exertion only with very strenuous activities such as climbing up a hill, otherwise she did not have any chest pain or shortness of breath with daily activity.   Past Medical History:  Diagnosis Date  . Arthritis   . Asthma    asthmatic bronchitis  . Dyspnea    on exertion  . GERD (gastroesophageal reflux disease)   . Heart murmur     Past Surgical History:  Procedure Laterality Date  . ABDOMINAL HYSTERECTOMY    . ANTERIOR CERVICAL DECOMP/DISCECTOMY FUSION  N/A 06/25/2016   Procedure: CERVICAL THREE- CERVICAL FOUR, CERVICAL SIX- CERVICAL SEVEN, CERVICAL SEVEN- THORACIC ONE  ANTERIOR CERVICAL DECOMPRESSION/DISCECTOMY FUSION, REMOVAL OF ANTERIOR CERVICAL PLATE CERVICAL FOUR- CERVICAL SIX;  Surgeon: Barnett Abu, MD;  Location: [facility] OR;  Service: Neurosurgery;  Laterality: N/A;  . CERVICAL SPINE SURGERY     4-5 :5-6 cervical fusion sixteen yrs ago    Current Medications: Current Meds  Medication Sig  . albuterol (VENTOLIN HFA) 108 (90 Base) MCG/ACT inhaler Ventolin HFA 90 mcg/actuation aerosol inhaler  INHALE 1 PUFF EVERY 4 HOURS  . aspirin 81 MG tablet Take 81 mg by mouth daily. Takes daily except when bleeding  . carboxymethylcellulose (REFRESH PLUS) 0.5 % SOLN Apply to eye.  . Cholecalciferol (VITAMIN D) 2000 UNITS CAPS Take 2,000 Units by mouth daily.   . Coenzyme Q10 300 MG CAPS Take 300 mg by mouth daily.  . Estradiol (YUVAFEM) 10 MCG TABS vaginal tablet Yuvafem 10 mcg vaginal tablet  . Lansoprazole (PREVACID PO) lansoprazole  . linaclotide (LINZESS) 145 MCG CAPS capsule TAKE 1 CAPSULE (145 MCG TOTAL) BY MOUTH DAILY.  Marland Kitchen MAGNESIUM GLYCINATE PLUS PO Take 2 tablets by mouth at bedtime.  . magnesium oxide (MAG-OX) 400 MG tablet Take by mouth.  . Omega-3 Fatty Acids (OMEGA-3 FISH OIL PO) Take 1 capsule by mouth daily.  Maxwell Caul Bicarbonate (ZEGERID) 20-1100 MG CAPS capsule Take 1 capsule by mouth at bedtime.  Marland Kitchen  Probiotic Product (ALIGN PO) Take 1 tablet by mouth daily.   . ranitidine (ZANTAC) 150 MG tablet ranitidine 150 mg tablet  . riboflavin (VITAMIN B-2) 100 MG TABS tablet Take 100 mg by mouth 2 (two) times daily.  . rosuvastatin (CRESTOR) 10 MG tablet rosuvastatin 10 mg tablet  . [DISCONTINUED] lansoprazole (PREVACID) 30 MG capsule lansoprazole 30 mg capsule,delayed release     Allergies:   Sulfa antibiotics, Other, and Percocet [oxycodone-acetaminophen]   Social History   Socioeconomic History  . Marital status: Married     Spouse name: Not on file  . Number of children: Not on file  . Years of education: Not on file  . Highest education level: Not on file  Occupational History  . Not on file  Tobacco Use  . Smoking status: Never Smoker  . Smokeless tobacco: Never Used  Substance and Sexual Activity  . Alcohol use: Yes    Comment: Once a month or less  . Drug use: Not on file  . Sexual activity: Not on file  Other Topics Concern  . Not on file  Social History Narrative  . Not on file   Social Determinants of Health   Financial Resource Strain:   . Difficulty of Paying Living Expenses:   Food Insecurity:   . Worried About Programme researcher, broadcasting/film/video in the Last Year:   . Barista in the Last Year:   Transportation Needs:   . Freight forwarder (Medical):   Marland Kitchen Lack of Transportation (Non-Medical):   Physical Activity:   . Days of Exercise per Week:   . Minutes of Exercise per Session:   Stress:   . Feeling of Stress :   Social Connections:   . Frequency of Communication with Friends and Family:   . Frequency of Social Gatherings with Friends and Family:   . Attends Religious Services:   . Active Member of Clubs or Organizations:   . Attends Banker Meetings:   Marland Kitchen Marital Status:      Family History: The patient's family history includes Breast cancer in her cousin and mother; Cancer in her mother; Diabetes in her maternal grandmother and paternal grandfather; Hypertension in her father.  ROS:   Please see the history of present illness.     All other systems reviewed and are negative.  EKGs/Labs/Other Studies Reviewed:    The following studies were reviewed today:  Myoview 09/01/2013 QPS Raw Data Images:  Rest image acquisition was grossly inadequate for accurate interpretation of reversibility.  Resting images are partially obscured by splanchnic tracer uptake leading to poor LV estimation.  Stress Images:  There is decreased uptake in the apex.  There is decreased  uptake in the anterior wall.  Focal Small area of moderate intensity perfusion defect in the anterior apical wall. Rest Images:  Poor LV Capture & alignment leads to inaccurate data acquistion.  Visually there does appear to be a similar anteroapical defect with partial reversibiltiy, however the intensity of the defect cannot be accurately estimated. Subtraction (SDS):  There appears to be a small area of anteroapical perfusion defect that cannot be accurately described.  Cannot exclude possible mild ischemia.  The lack of wall motion abnormality or decreased thickening argues against a prior infarction. With the location, cannot exclude breast attenuation. LV Wall Motion:  NL LV Function; NL Wall Motion   Impression Exercise Capacity:  Excellent exercise capacity. BP Response:  Hypertensive blood pressure response. Clinical Symptoms:  There is dyspnea.  No Angina  ECG Impression:  Insignificant upsloping ST segment depression. Duke TM Score of 10 (reaching Stage IV) - LOW RISK TM Score. Comparison with Prior Nuclear Study: No images to compare  Overall Impression:  Indeterminate Study due to poor Resting Image data aquisition.  Clinically, with a Duke TM Score of 10, notably in the absence of Angina, anteroapical ischmia is unlikely.  Clinical correlation is warranted.   Echo 09/01/2013 LV EF: 55% -  60%   -------------------------------------------------------------------  Indications:   786.05 Dyspnea.   -------------------------------------------------------------------  History:  PMH: Palpitations.   -------------------------------------------------------------------  Study Conclusions   - Left ventricle: The cavity size was normal. Wall thickness was  normal. Systolic function was normal. The estimated ejection  fraction was in the range of 55% to 60%. Wall motion was normal;  there were no regional wall motion abnormalities. Doppler  parameters are consistent  with abnormal left ventricular  relaxation (grade 1 diastolic dysfunction).  - Aortic valve: There was trivial regurgitation.   Impressions:   - Normal LV function, grade 1 diastolic dysfunction; trace AI and  MR.    EKG:  EKG is ordered today.  The ekg ordered today demonstrates normal sinus rhythm, poor R progression anterior leads.  T wave inversions in V1 and V2.  Recent Labs: No results found for requested labs within last 8760 hours.  Recent Lipid Panel    Component Value Date/Time   CHOL 161 10/20/2014 0933   CHOL 169 10/08/2013 0911   TRIG 42 10/20/2014 0933   TRIG 53 10/08/2013 0911   HDL 85 10/20/2014 0933   HDL 83 10/08/2013 0911   CHOLHDL 1.9 10/20/2014 0933   VLDL 8 10/20/2014 0933   LDLCALC 68 10/20/2014 0933   LDLCALC 75 10/08/2013 0911    Physical Exam:    VS:  BP 120/82   Pulse 64   Temp 98.1 F (36.7 C)   Ht 5' 2.5" (1.588 m)   Wt 105 lb (47.6 kg)   SpO2 99%   BMI 18.90 kg/m     Wt Readings from Last 3 Encounters:  05/13/19 105 lb (47.6 kg)  12/17/16 106 lb (48.1 kg)  06/25/16 116 lb (52.6 kg)     GEN:  Well nourished, well developed in no acute distress HEENT: Normal NECK: No JVD; No carotid bruits LYMPHATICS: No lymphadenopathy CARDIAC: RRR, no murmurs, rubs, gallops RESPIRATORY:  Clear to auscultation without rales, wheezing or rhonchi  ABDOMEN: Soft, non-tender, non-distended MUSCULOSKELETAL:  No edema; No deformity  SKIN: Warm and dry NEUROLOGIC:  Alert and oriented x 3 PSYCHIATRIC:  Normal affect   ASSESSMENT:    1. DOE (dyspnea on exertion)   2. Hyperlipidemia LDL goal <100    PLAN:    In order of problems listed above:  1. Dyspnea on exertion: Mrs. Amison is very active and only has dyspnea when she is climbing up a hill.  She can do everyday activity without any issue.  Since she has not had any chest discomfort or worsening dyspnea, I did not recommend any further work-up  2. Hyperlipidemia: On statin medication.   She has excellent cholesterol control.  Continue current therapy.   Medication Adjustments/Labs and Tests Ordered: Current medicines are reviewed at length with the patient today.  Concerns regarding medicines are outlined above.  Orders Placed This Encounter  Procedures  . EKG 12-Lead   No orders of the defined types were placed in this encounter.   Patient Instructions  Medication Instructions:  Your physician  recommends that you continue on your current medications as directed. Please refer to the Current Medication list given to you today.  *If you need a refill on your cardiac medications before your next appointment, please call your pharmacy*  Lab Work: NONE ordered at this time of appointment   If you have labs (blood work) drawn today and your tests are completely normal, you will receive your results only by: Marland Kitchen MyChart Message (if you have MyChart) OR . A paper copy in the mail If you have any lab test that is abnormal or we need to change your treatment, we will call you to review the results.  Testing/Procedures: NONE ordered at this time of appointment   Follow-Up: At [facility], you and your health needs are our priority.  As part of our continuing mission to provide you with exceptional heart care, we have created designated Provider Care Teams.  These Care Teams include your primary Cardiologist (physician) and Advanced Practice Providers (APPs -  Physician Assistants and Nurse Practitioners) who all work together to provide you with the care you need, when you need it.  We recommend signing up for the patient portal called "MyChart".  Sign up information is provided on this After Visit Summary.  MyChart is used to connect with patients for Virtual Visits (Telemedicine).  Patients are able to view lab/test results, encounter notes, upcoming appointments, etc.  Non-urgent messages can be sent to your provider as well.   To learn more about what you can do with  MyChart, go to [email or web address].    Your next appointment:   2 year(s)  The format for your next appointment:   In Person  Provider:   Shelva Majestic, MD  Other Instructions      Signed, Almyra Deforest, [place]  05/15/2019 11:36 PM    [place]

## 2019-05-13 NOTE — Patient Instructions (Signed)
Medication Instructions:  Your physician recommends that you continue on your current medications as directed. Please refer to the Current Medication list given to you today.  *If you need a refill on your cardiac medications before your next appointment, please call your pharmacy*  Lab Work: NONE ordered at this time of appointment   If you have labs (blood work) drawn today and your tests are completely normal, you will receive your results only by: Marland Kitchen MyChart Message (if you have MyChart) OR . A paper copy in the mail If you have any lab test that is abnormal or we need to change your treatment, we will call you to review the results.  Testing/Procedures: NONE ordered at this time of appointment   Follow-Up: At [facility], you and your health needs are our priority.  As part of our continuing mission to provide you with exceptional heart care, we have created designated Provider Care Teams.  These Care Teams include your primary Cardiologist (physician) and Advanced Practice Providers (APPs -  Physician Assistants and Nurse Practitioners) who all work together to provide you with the care you need, when you need it.  We recommend signing up for the patient portal called "MyChart".  Sign up information is provided on this After Visit Summary.  MyChart is used to connect with patients for Virtual Visits (Telemedicine).  Patients are able to view lab/test results, encounter notes, upcoming appointments, etc.  Non-urgent messages can be sent to your provider as well.   To learn more about what you can do with MyChart, go to [email or web address].    Your next appointment:   2 year(s)  The format for your next appointment:   In Person  Provider:   Nicki Guadalajara, MD  Other Instructions

## 2019-05-15 ENCOUNTER — Encounter: Payer: Self-pay | Admitting: Physician Assistant

## 2019-08-24 ENCOUNTER — Other Ambulatory Visit: Payer: Self-pay | Admitting: Obstetrics and Gynecology

## 2019-08-24 DIAGNOSIS — Z1231 Encounter for screening mammogram for malignant neoplasm of breast: Secondary | ICD-10-CM

## 2019-09-02 DIAGNOSIS — Z20822 Contact with and (suspected) exposure to covid-19: Secondary | ICD-10-CM | POA: Diagnosis not present

## 2019-09-02 DIAGNOSIS — Z03818 Encounter for observation for suspected exposure to other biological agents ruled out: Secondary | ICD-10-CM | POA: Diagnosis not present

## 2019-10-29 ENCOUNTER — Other Ambulatory Visit: Payer: Self-pay

## 2019-10-29 ENCOUNTER — Ambulatory Visit
Admission: RE | Admit: 2019-10-29 | Discharge: 2019-10-29 | Disposition: A | Discharge status: Home / Self Care | Payer: BC Managed Care – PPO | Source: Ambulatory Visit | Attending: Obstetrics and Gynecology | Admitting: Obstetrics and Gynecology

## 2019-10-29 DIAGNOSIS — Z1231 Encounter for screening mammogram for malignant neoplasm of breast: Secondary | ICD-10-CM

## 2019-11-15 ENCOUNTER — Other Ambulatory Visit: Payer: Self-pay | Admitting: Family Medicine

## 2019-11-15 DIAGNOSIS — M8588 Other specified disorders of bone density and structure, other site: Secondary | ICD-10-CM | POA: Diagnosis not present

## 2019-11-15 DIAGNOSIS — E785 Hyperlipidemia, unspecified: Secondary | ICD-10-CM | POA: Diagnosis not present

## 2019-11-15 DIAGNOSIS — M8580 Other specified disorders of bone density and structure, unspecified site: Secondary | ICD-10-CM

## 2019-11-15 DIAGNOSIS — Z0000 Encounter for general adult medical examination without abnormal findings: Secondary | ICD-10-CM | POA: Diagnosis not present

## 2019-11-15 DIAGNOSIS — K219 Gastro-esophageal reflux disease without esophagitis: Secondary | ICD-10-CM | POA: Diagnosis not present

## 2019-11-30 DIAGNOSIS — H40013 Open angle with borderline findings, low risk, bilateral: Secondary | ICD-10-CM | POA: Diagnosis not present

## 2019-11-30 DIAGNOSIS — H524 Presbyopia: Secondary | ICD-10-CM | POA: Diagnosis not present

## 2019-11-30 DIAGNOSIS — H5202 Hypermetropia, left eye: Secondary | ICD-10-CM | POA: Diagnosis not present

## 2019-11-30 DIAGNOSIS — H2513 Age-related nuclear cataract, bilateral: Secondary | ICD-10-CM | POA: Diagnosis not present

## 2019-12-16 ENCOUNTER — Ambulatory Visit
Admission: RE | Admit: 2019-12-16 | Discharge: 2019-12-16 | Disposition: A | Discharge status: Home / Self Care | Payer: BC Managed Care – PPO | Source: Ambulatory Visit | Attending: Obstetrics and Gynecology | Admitting: Obstetrics and Gynecology

## 2019-12-16 ENCOUNTER — Other Ambulatory Visit: Payer: Self-pay

## 2019-12-16 DIAGNOSIS — Z1231 Encounter for screening mammogram for malignant neoplasm of breast: Secondary | ICD-10-CM | POA: Diagnosis not present

## 2019-12-16 IMAGING — MG DIGITAL SCREENING BILAT W/ TOMO W/ CAD
8 series · 8 of 24 positions shown · non-contrast
Comparison: Previous exam(s).

CLINICAL DATA: Screening.

EXAM:
DIGITAL SCREENING BILATERAL MAMMOGRAM WITH TOMO AND CAD

[R CC synth-2D]
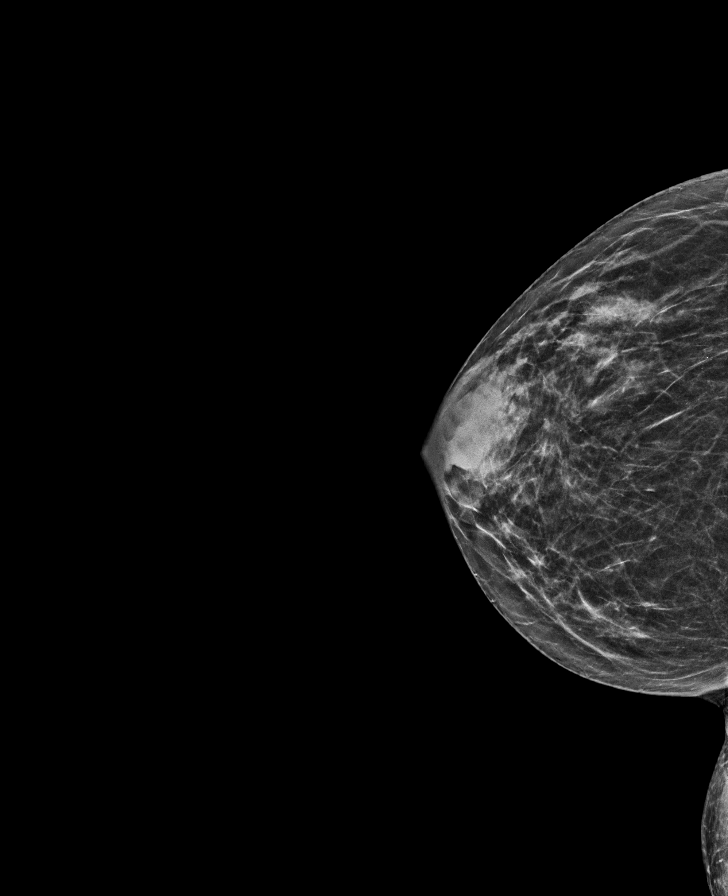

[L MLO synth-2D]
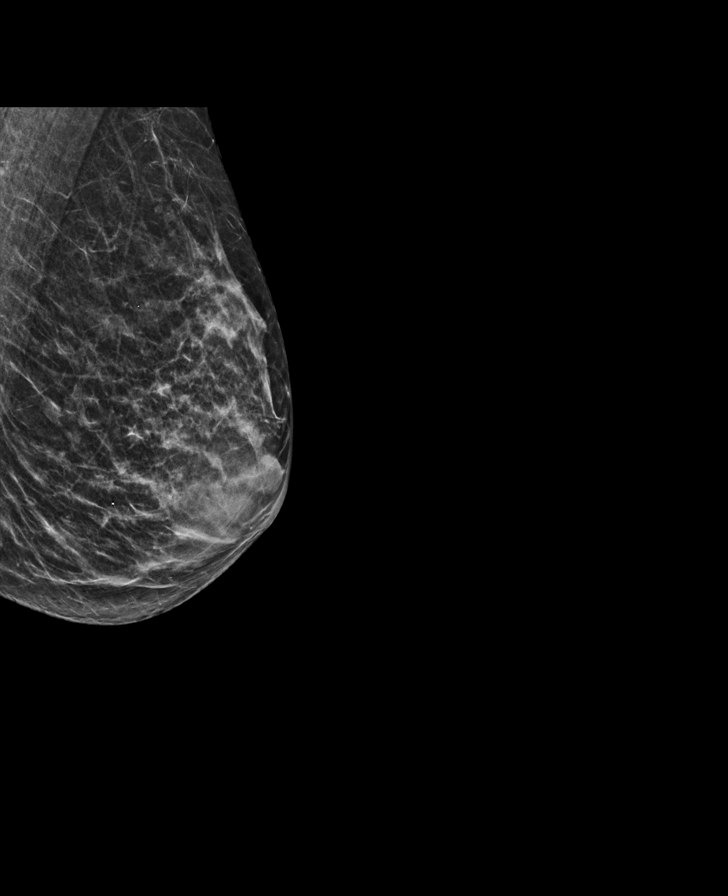

[L CC synth-2D]
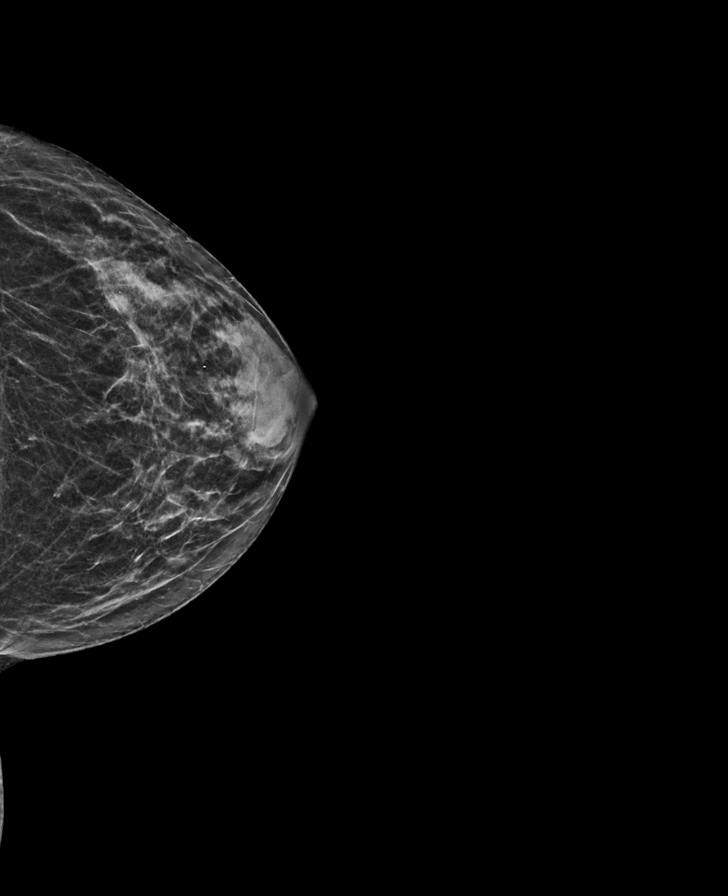

[R MLO synth-2D]
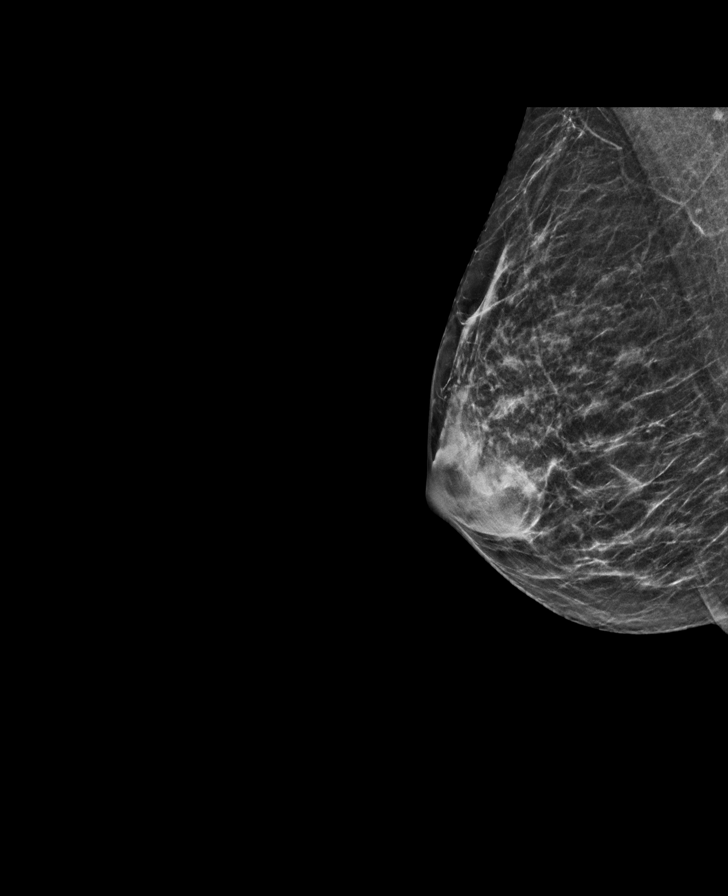

[L MLO tomo · tomo slice 23/44.0]
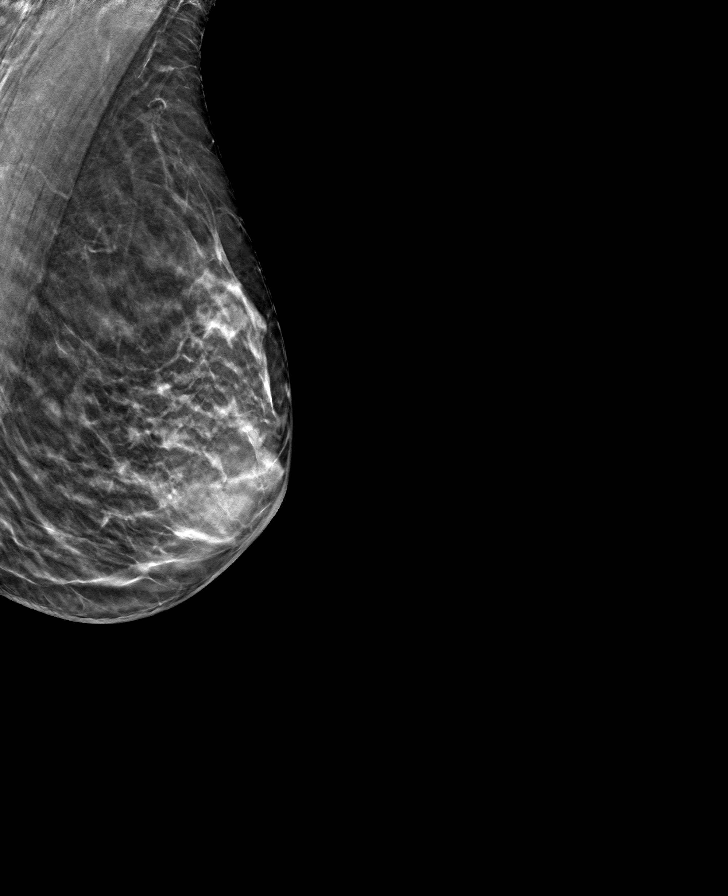

[R MLO tomo · tomo slice 25/48.0]
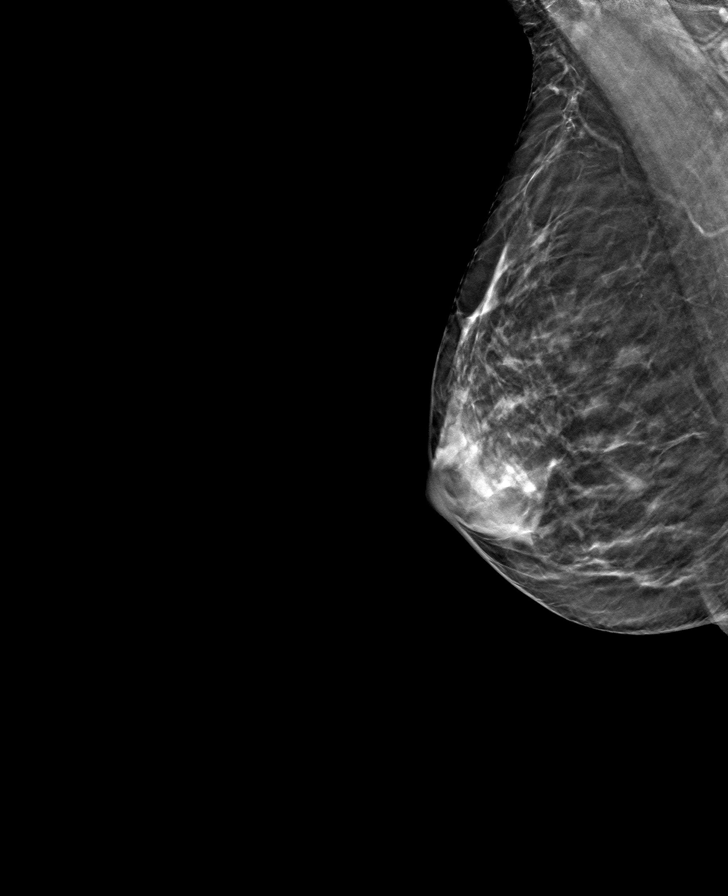

[L CC tomo · tomo slice 25/49.0]
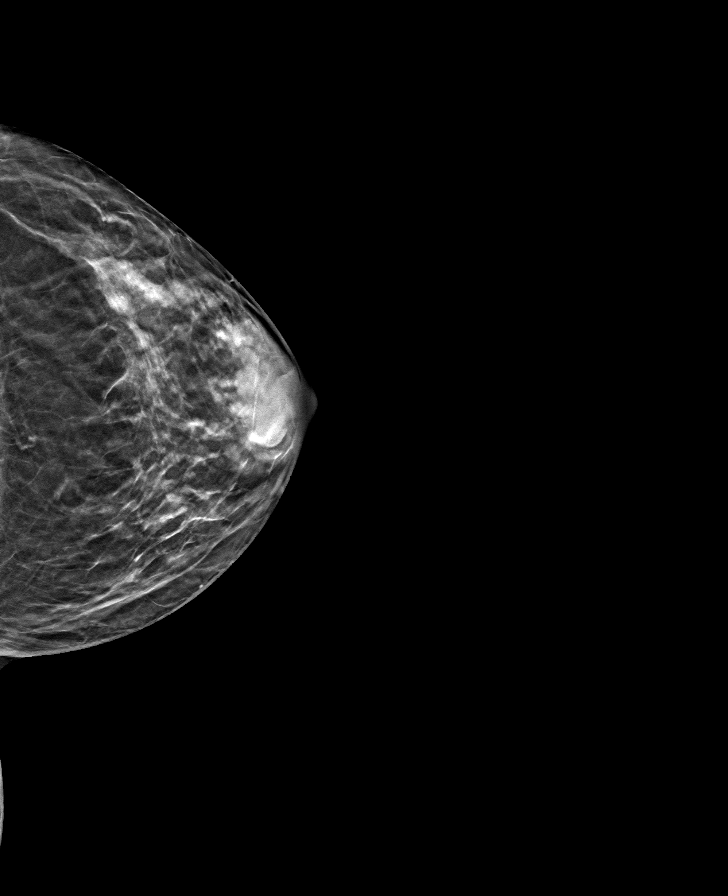

[R CC tomo · tomo slice 25/48.0]
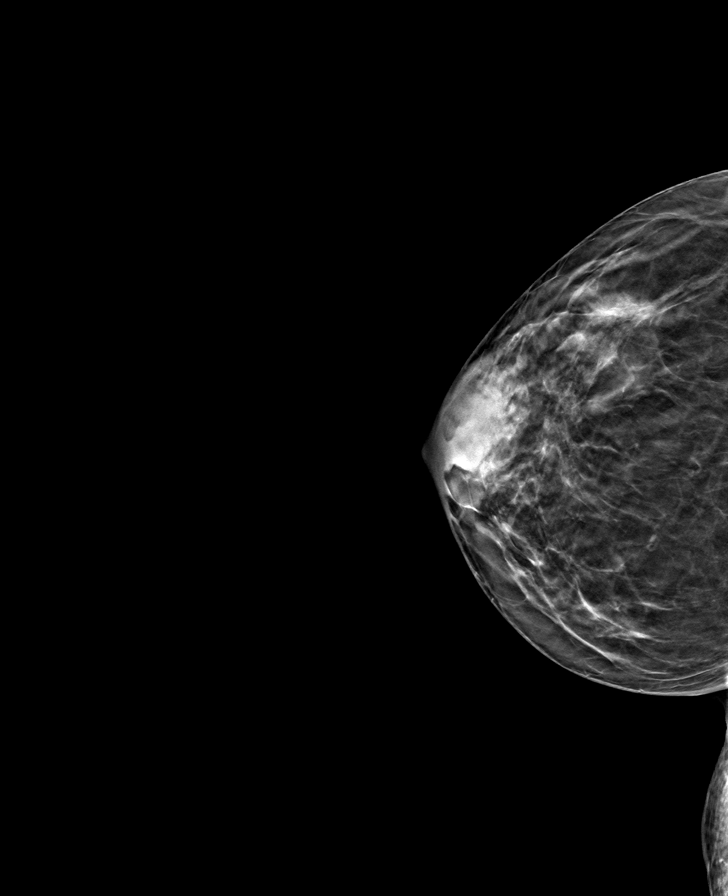

[8 of 24 positions shown; findings below may reference images not displayed]

ACR Breast Density Category c: The breast tissue is heterogeneously
dense, which may obscure small masses.
FINDINGS: There are no findings suspicious for malignancy. Images were
processed with CAD.
IMPRESSION: No mammographic evidence of malignancy. A result letter of this
screening mammogram will be mailed directly to the patient.

RECOMMENDATION:
Screening mammogram in one year. (Code:[5V])

BI-RADS CATEGORY  1: Negative.

## 2020-02-08 DIAGNOSIS — Z01419 Encounter for gynecological examination (general) (routine) without abnormal findings: Secondary | ICD-10-CM | POA: Diagnosis not present

## 2020-02-08 DIAGNOSIS — Z681 Body mass index (BMI) 19 or less, adult: Secondary | ICD-10-CM | POA: Diagnosis not present

## 2020-03-01 ENCOUNTER — Ambulatory Visit
Admission: RE | Admit: 2020-03-01 | Discharge: 2020-03-01 | Disposition: A | Discharge status: Home / Self Care | Payer: BC Managed Care – PPO | Source: Ambulatory Visit | Attending: Family Medicine | Admitting: Family Medicine

## 2020-03-01 ENCOUNTER — Other Ambulatory Visit: Payer: Self-pay

## 2020-03-01 DIAGNOSIS — Z780 Asymptomatic menopausal state: Secondary | ICD-10-CM | POA: Diagnosis not present

## 2020-03-01 DIAGNOSIS — M85852 Other specified disorders of bone density and structure, left thigh: Secondary | ICD-10-CM | POA: Diagnosis not present

## 2020-03-01 DIAGNOSIS — M8580 Other specified disorders of bone density and structure, unspecified site: Secondary | ICD-10-CM

## 2020-05-22 ENCOUNTER — Telehealth (HOSPITAL_COMMUNITY): Payer: Self-pay

## 2020-05-22 NOTE — Telephone Encounter (Addendum)
Called to discuss with patient about COVID-19 symptoms and the use of one of the available treatments for those with mild to moderate Covid symptoms and at a high risk of hospitalization.  Pt MAY appear to qualify for outpatient treatment due to co-morbid conditions and/or a member of an at-risk group in accordance with the FDA Emergency Use Authorization.    Symptom onset: 05/17/20 or 05/18/20, pt not really sure, c/o sore throat, decreased appetite, "hard to swallow", fatigue Vaccinated: Yes Booster? Yes Immunocompromised? No Qualifiers: HLD, has hx of asthma and has a rescue inhaler, but has not used in over 1 year Labs: Not in last 3 months  RN informed pt that one of our providers will call pt back to discuss any treatment options available.     Essie Hart, RN

## 2020-05-23 ENCOUNTER — Other Ambulatory Visit: Payer: Self-pay | Admitting: Adult Health

## 2020-05-23 NOTE — Progress Notes (Signed)
I connected by phone with [name] on 05/23/2020 at 3:01 PM to discuss the potential use of a new treatment for mild to moderate COVID-19 viral infection in non-hospitalized patients.  This patient is a 60 y.o. female that meets the FDA criteria for Emergency Use Authorization of COVID monoclonal antibody sotrovimab.  Has a (+) direct SARS-CoV-2 viral test result  Has mild or moderate COVID-19   Is NOT hospitalized due to COVID-19  Is within 10 days of symptom onset  Has at least one of the high risk factor(s) for progression to severe COVID-19 and/or hospitalization as defined in EUA.  Specific high risk criteria : Cardiovascular disease or hypertension   Cost reviewed  Sx onset 3/23   I have spoken and communicated the following to the patient or parent/caregiver regarding COVID monoclonal antibody treatment:  1. FDA has authorized the emergency use for the treatment of mild to moderate COVID-19 in adults and pediatric patients with positive results of direct SARS-CoV-2 viral testing who are 86 years of age and older weighing at least 40 kg, and who are at high risk for progressing to severe COVID-19 and/or hospitalization.  2. The significant known and potential risks and benefits of COVID monoclonal antibody, and the extent to which such potential risks and benefits are unknown.  3. Information on available alternative treatments and the risks and benefits of those alternatives, including clinical trials.  4. Patients treated with COVID monoclonal antibody should continue to self-isolate and use infection control measures (e.g., wear mask, isolate, social distance, avoid sharing personal items, clean and disinfect "high touch" surfaces, and frequent handwashing) according to CDC guidelines.   5. The patient or parent/caregiver has the option to accept or refuse COVID monoclonal antibody treatment.  After reviewing this information with the patient, the patient has  agreed to receive one of the available covid 19 monoclonal antibodies and will be provided an appropriate fact sheet prior to infusion. Noreene Filbert, NP 05/23/2020 3:01 PM

## 2020-05-24 ENCOUNTER — Ambulatory Visit (HOSPITAL_COMMUNITY)
Admission: RE | Admit: 2020-05-24 | Discharge: 2020-05-24 | Disposition: A | Discharge status: Home / Self Care | Payer: BC Managed Care – PPO | Source: Ambulatory Visit | Attending: Pulmonary Disease | Admitting: Pulmonary Disease

## 2020-05-24 ENCOUNTER — Other Ambulatory Visit: Payer: Self-pay | Admitting: Physician Assistant

## 2020-05-24 DIAGNOSIS — U071 COVID-19: Secondary | ICD-10-CM

## 2020-05-24 DIAGNOSIS — J45909 Unspecified asthma, uncomplicated: Secondary | ICD-10-CM | POA: Diagnosis not present

## 2020-05-24 DIAGNOSIS — E785 Hyperlipidemia, unspecified: Secondary | ICD-10-CM | POA: Diagnosis not present

## 2020-05-24 MED ORDER — METHYLPREDNISOLONE SODIUM SUCC 125 MG IJ SOLR: 125.0000 mg | Freq: Once | INTRAMUSCULAR | Status: DC | PRN

## 2020-05-24 MED ORDER — SODIUM CHLORIDE 0.9 % IV SOLN: INTRAVENOUS | Status: DC | PRN

## 2020-05-24 MED ORDER — SOTROVIMAB 500 MG/8ML IV SOLN
500.0000 mg | Freq: Once | INTRAVENOUS | Status: AC
  Administered 2020-05-24: 500 mg via INTRAVENOUS

## 2020-05-24 MED ORDER — DIPHENHYDRAMINE HCL 50 MG/ML IJ SOLN: 50.0000 mg | Freq: Once | INTRAMUSCULAR | Status: DC | PRN

## 2020-05-24 MED ORDER — FAMOTIDINE IN NACL 20-0.9 MG/50ML-% IV SOLN: 20.0000 mg | Freq: Once | INTRAVENOUS | Status: DC | PRN

## 2020-05-24 MED ORDER — EPINEPHRINE 0.3 MG/0.3ML IJ SOAJ: 0.3000 mg | Freq: Once | INTRAMUSCULAR | Status: DC | PRN

## 2020-05-24 MED ORDER — ALBUTEROL SULFATE HFA 108 (90 BASE) MCG/ACT IN AERS: 2.0000 | INHALATION_SPRAY | Freq: Once | RESPIRATORY_TRACT | Status: DC | PRN

## 2020-05-24 NOTE — Progress Notes (Signed)
Diagnosis: COVID-19  Physician: Dr. Patrick Wright  Procedure: Covid Infusion Clinic Med: Sotrovimab infusion - Provided patient with sotrovimab fact sheet for patients, parents, and caregivers prior to infusion.   Complications: No immediate complications noted  Discharge: Discharged home    

## 2020-05-24 NOTE — Progress Notes (Signed)
Patient reviewed Fact Sheet for Patients, Parents, and Caregivers for Emergency Use Authorization (EUA) of sotrovimab for the Treatment of Coronavirus. Patient also reviewed and is agreeable to the estimated cost of treatment. Patient is agreeable to proceed.   

## 2020-05-24 NOTE — Progress Notes (Signed)
Consent done previously by Lillard Anes NP

## 2020-05-24 NOTE — Discharge Instructions (Signed)
10 Things You Can Do to Manage Your COVID-19 Symptoms at Home If you have possible or confirmed COVID-19: 1. Stay home except to get medical care. 2. Monitor your symptoms carefully. If your symptoms get worse, call your healthcare provider immediately. 3. Get rest and stay hydrated. 4. If you have a medical appointment, call the healthcare provider ahead of time and tell them that you have or may have COVID-19. 5. For medical emergencies, call 911 and notify the dispatch personnel that you have or may have COVID-19. 6. Cover your cough and sneezes with a tissue or use the inside of your elbow. 7. Wash your hands often with soap and water for at least 20 seconds or clean your hands with an alcohol-based hand sanitizer that contains at least 60% alcohol. 8. As much as possible, stay in a specific room and away from other people in your home. Also, you should use a separate bathroom, if available. If you need to be around other people in or outside of the home, wear a mask. 9. Avoid sharing personal items with other people in your household, like dishes, towels, and bedding. 10. Clean all surfaces that are touched often, like counters, tabletops, and doorknobs. Use household cleaning sprays or wipes according to the label instructions. cdc.gov/coronavirus 09/10/2019 This information is not intended to replace advice given to you by your health care provider. Make sure you discuss any questions you have with your health care provider. Document Revised: 12/27/2019 Document Reviewed: 12/27/2019 Elsevier Patient Education  2021 Elsevier Inc.  What types of side effects do monoclonal antibody drugs cause?  Common side effects  In general, the more common side effects caused by monoclonal antibody drugs include: . Allergic reactions, such as hives or itching . Flu-like signs and symptoms, including chills, fatigue, fever, and muscle aches and pains . Nausea, vomiting . Diarrhea . Skin  rashes . Low blood pressure   The CDC is recommending patients who receive monoclonal antibody treatments wait at least 90 days before being vaccinated.  Currently, there are no data on the safety and efficacy of mRNA COVID-19 vaccines in persons who received monoclonal antibodies or convalescent plasma as part of COVID-19 treatment. Based on the estimated half-life of such therapies as well as evidence suggesting that reinfection is uncommon in the 90 days after initial infection, vaccination should be deferred for at least 90 days, as a precautionary measure until additional information becomes available, to avoid interference of the antibody treatment with vaccine-induced immune responses.   If someone you know is interested in receiving treatment please have them contact their MD for a referral or visit [email or web address]    

## 2020-05-26 DIAGNOSIS — H40013 Open angle with borderline findings, low risk, bilateral: Secondary | ICD-10-CM | POA: Diagnosis not present

## 2020-07-12 DIAGNOSIS — R102 Pelvic and perineal pain: Secondary | ICD-10-CM | POA: Diagnosis not present

## 2020-11-06 DIAGNOSIS — K5904 Chronic idiopathic constipation: Secondary | ICD-10-CM | POA: Diagnosis not present

## 2020-11-16 DIAGNOSIS — Z23 Encounter for immunization: Secondary | ICD-10-CM | POA: Diagnosis not present

## 2020-11-16 DIAGNOSIS — Z0000 Encounter for general adult medical examination without abnormal findings: Secondary | ICD-10-CM | POA: Diagnosis not present

## 2020-11-16 DIAGNOSIS — M8580 Other specified disorders of bone density and structure, unspecified site: Secondary | ICD-10-CM | POA: Diagnosis not present

## 2020-11-16 DIAGNOSIS — K219 Gastro-esophageal reflux disease without esophagitis: Secondary | ICD-10-CM | POA: Diagnosis not present

## 2020-11-16 DIAGNOSIS — E785 Hyperlipidemia, unspecified: Secondary | ICD-10-CM | POA: Diagnosis not present

## 2020-11-16 DIAGNOSIS — M8588 Other specified disorders of bone density and structure, other site: Secondary | ICD-10-CM | POA: Diagnosis not present

## 2020-12-04 DIAGNOSIS — D649 Anemia, unspecified: Secondary | ICD-10-CM | POA: Diagnosis not present

## 2020-12-19 ENCOUNTER — Other Ambulatory Visit: Payer: Self-pay | Admitting: Family Medicine

## 2020-12-19 DIAGNOSIS — Z1231 Encounter for screening mammogram for malignant neoplasm of breast: Secondary | ICD-10-CM

## 2020-12-26 DIAGNOSIS — H52203 Unspecified astigmatism, bilateral: Secondary | ICD-10-CM | POA: Diagnosis not present

## 2020-12-26 DIAGNOSIS — H40013 Open angle with borderline findings, low risk, bilateral: Secondary | ICD-10-CM | POA: Diagnosis not present

## 2020-12-26 DIAGNOSIS — H2513 Age-related nuclear cataract, bilateral: Secondary | ICD-10-CM | POA: Diagnosis not present

## 2020-12-26 DIAGNOSIS — H5202 Hypermetropia, left eye: Secondary | ICD-10-CM | POA: Diagnosis not present

## 2021-01-30 DIAGNOSIS — D649 Anemia, unspecified: Secondary | ICD-10-CM | POA: Diagnosis not present

## 2021-03-05 ENCOUNTER — Ambulatory Visit
Admission: RE | Admit: 2021-03-05 | Discharge: 2021-03-05 | Disposition: A | Discharge status: Home / Self Care | Payer: BC Managed Care – PPO | Source: Ambulatory Visit | Attending: Family Medicine | Admitting: Family Medicine

## 2021-03-05 DIAGNOSIS — Z1231 Encounter for screening mammogram for malignant neoplasm of breast: Secondary | ICD-10-CM | POA: Diagnosis not present

## 2021-03-27 DIAGNOSIS — Z681 Body mass index (BMI) 19 or less, adult: Secondary | ICD-10-CM | POA: Diagnosis not present

## 2021-03-27 DIAGNOSIS — Z01419 Encounter for gynecological examination (general) (routine) without abnormal findings: Secondary | ICD-10-CM | POA: Diagnosis not present

## 2021-03-27 DIAGNOSIS — B3731 Acute candidiasis of vulva and vagina: Secondary | ICD-10-CM | POA: Diagnosis not present

## 2021-07-30 ENCOUNTER — Ambulatory Visit: Payer: BC Managed Care – PPO | Admitting: Cardiovascular Disease

## 2021-10-01 DIAGNOSIS — H40013 Open angle with borderline findings, low risk, bilateral: Secondary | ICD-10-CM | POA: Diagnosis not present

## 2021-11-01 DIAGNOSIS — D225 Melanocytic nevi of trunk: Secondary | ICD-10-CM | POA: Diagnosis not present

## 2021-11-01 DIAGNOSIS — L821 Other seborrheic keratosis: Secondary | ICD-10-CM | POA: Diagnosis not present

## 2021-11-01 DIAGNOSIS — D2262 Melanocytic nevi of left upper limb, including shoulder: Secondary | ICD-10-CM | POA: Diagnosis not present

## 2021-11-01 DIAGNOSIS — D2261 Melanocytic nevi of right upper limb, including shoulder: Secondary | ICD-10-CM | POA: Diagnosis not present

## 2021-11-02 DIAGNOSIS — J029 Acute pharyngitis, unspecified: Secondary | ICD-10-CM | POA: Diagnosis not present

## 2021-11-20 DIAGNOSIS — K219 Gastro-esophageal reflux disease without esophagitis: Secondary | ICD-10-CM | POA: Diagnosis not present

## 2021-11-20 DIAGNOSIS — J4520 Mild intermittent asthma, uncomplicated: Secondary | ICD-10-CM | POA: Diagnosis not present

## 2021-11-20 DIAGNOSIS — E785 Hyperlipidemia, unspecified: Secondary | ICD-10-CM | POA: Diagnosis not present

## 2021-11-20 DIAGNOSIS — J309 Allergic rhinitis, unspecified: Secondary | ICD-10-CM | POA: Diagnosis not present

## 2021-11-20 DIAGNOSIS — M8588 Other specified disorders of bone density and structure, other site: Secondary | ICD-10-CM | POA: Diagnosis not present

## 2021-11-20 DIAGNOSIS — Z0000 Encounter for general adult medical examination without abnormal findings: Secondary | ICD-10-CM | POA: Diagnosis not present

## 2021-12-06 ENCOUNTER — Encounter: Payer: Self-pay | Admitting: Cardiovascular Disease

## 2021-12-06 ENCOUNTER — Ambulatory Visit: Payer: BC Managed Care – PPO | Attending: Cardiovascular Disease | Admitting: Cardiovascular Disease

## 2021-12-06 VITALS — BP 105/70 | HR 70 | Ht 61.5 in | Wt 105.2 lb

## 2021-12-06 DIAGNOSIS — E7800 Pure hypercholesterolemia, unspecified: Secondary | ICD-10-CM | POA: Diagnosis not present

## 2021-12-06 DIAGNOSIS — K219 Gastro-esophageal reflux disease without esophagitis: Secondary | ICD-10-CM

## 2021-12-06 DIAGNOSIS — R0602 Shortness of breath: Secondary | ICD-10-CM | POA: Diagnosis not present

## 2021-12-06 DIAGNOSIS — I5189 Other ill-defined heart diseases: Secondary | ICD-10-CM | POA: Diagnosis not present

## 2021-12-06 DIAGNOSIS — J45909 Unspecified asthma, uncomplicated: Secondary | ICD-10-CM

## 2021-12-06 NOTE — Patient Instructions (Signed)
Medication Instructions:  Your Physician recommend you continue on your current medication as directed.    *If you need a refill on your cardiac medications before your next appointment, please call your pharmacy*   Lab Work: None ordered today   Testing/Procedures: Your physician has requested that you have an echocardiogram. Echocardiography is a painless test that uses sound waves to create images of your heart. It provides your doctor with information about the size and shape of your heart and how well your heart's chambers and valves are working. This procedure takes approximately one hour. There are no restrictions for this procedure. [address]   CT coronary calcium score.   Test locations:  [place]   This is $99 out of pocket.   Coronary CalciumScan A coronary calcium scan is an imaging test used to look for deposits of calcium and other fatty materials (plaques) in the inner lining of the blood vessels of the heart (coronary arteries). These deposits of calcium and plaques can partly clog and narrow the coronary arteries without producing any symptoms or warning signs. This puts a person at risk for a heart attack. This test can detect these deposits before symptoms develop. Tell a health care provider about: Any allergies you have. All medicines you are taking, including vitamins, herbs, eye drops, creams, and over-the-counter medicines. Any problems you or family members have had with anesthetic medicines. Any blood disorders you have. Any surgeries you have had. Any medical conditions you have. Whether you are pregnant or may be pregnant. What are the risks? Generally, this is a safe procedure. However, problems may occur, including: Harm to a pregnant woman and her unborn baby. This test involves the use of radiation. Radiation exposure can be dangerous to a pregnant woman and her unborn baby. If you are pregnant, you generally should  not have this procedure done. Slight increase in the risk of cancer. This is because of the radiation involved in the test. What happens before the procedure? No preparation is needed for this procedure. What happens during the procedure? You will undress and remove any jewelry around your neck or chest. You will put on a hospital gown. Sticky electrodes will be placed on your chest. The electrodes will be connected to an electrocardiogram (ECG) machine to record a tracing of the electrical activity of your heart. A CT scanner will take pictures of your heart. During this time, you will be asked to lie still and hold your breath for 2-3 seconds while a picture of your heart is being taken. The procedure may vary among health care providers and hospitals. What happens after the procedure? You can get dressed. You can return to your normal activities. It is up to you to get the results of your test. Ask your health care provider, or the department that is doing the test, when your results will be ready. Summary A coronary calcium scan is an imaging test used to look for deposits of calcium and other fatty materials (plaques) in the inner lining of the blood vessels of the heart (coronary arteries). Generally, this is a safe procedure. Tell your health care provider if you are pregnant or may be pregnant. No preparation is needed for this procedure. A CT scanner will take pictures of your heart. You can return to your normal activities after the scan is done. This information is not intended to replace advice given to you by your health care provider. Make sure you discuss any  questions you have with your health care provider. Document Released: 08/10/2007 Document Revised: 01/01/2016 Document Reviewed: 01/01/2016 Elsevier Interactive Patient Education  2017 [employer].     Follow-Up: At [facility], you and your health needs are our priority.  As part of our continuing mission  to provide you with exceptional heart care, we have created designated Provider Care Teams.  These Care Teams include your primary Cardiologist (physician) and Advanced Practice Providers (APPs -  Physician Assistants and Nurse Practitioners) who all work together to provide you with the care you need, when you need it.  We recommend signing up for the patient portal called "MyChart".  Sign up information is provided on this After Visit Summary.  MyChart is used to connect with patients for Virtual Visits (Telemedicine).  Patients are able to view lab/test results, encounter notes, upcoming appointments, etc.  Non-urgent messages can be sent to your provider as well.   To learn more about what you can do with MyChart, go to [email or web address].    Your next appointment:   3 month(s)  The format for your next appointment:   In Person  Provider:   Nicki Guadalajara, MD

## 2021-12-06 NOTE — Progress Notes (Signed)
Patient ID: [name], female   DOB: 05/09/1960, 61 y.o.   MRN: [id number]      Primary MD: Dr. Kelton Pillar (retired);   HPI:  [name] is a 61 y.o. female  who is the the wife of [name].  [name] established cardiology care with me in 2015 and I last saw her on December 17, 2016.  [name] presents for follow-up evaluation.  [name] denies any prior cardiac history.  [name] has remained fairly active and walks regularly with her husband.  In 2015 [name] noticed a definite change with development of more shortness of breath with activity and experienced a vague sensation of chest tightness which can occur at night.  [name] does not believe this is due to asthma.  [name] also has noticed some occasional palpitations.  [name] saw Dr. Laurann Montana on 08/04/2013 and saw me for initial cardiology evaluation shortly thereafter.  A 2-D echo Doppler study on 09/01/2013 showed an ejection fraction of 55-60%.  [name] had normal wall motion without regional wall motion abnormalities.  Doppler parameters are consistent with mild grade 1 diastolic dysfunction.  There was trivial aortic insufficiency.  [name] underwent a nuclear perfusion study and exercise for 10 minutes and 21 seconds.  [name] stopped secondary to fatigue.  There were no diagnostic ST changes.  It was reported that [name] had a mild hypertensive response to exercise.  There was a mild defect  in the anteroapical region and it was felt most likely that this was breast attenuation artifact.  However, a minimal region of ischemia could not be completely excluded.  [name] had normal contractility with an ejection fraction of 66%.  When I saw her I recommended initiation of statin therapy in light of her lipid panel done by Dr. Coral Spikes in June 2015 which showed a total cholesterol 255, triglycerides 52, HDL 79, but an LDL of 166.  [name] has been taking Crestor 10 mg.  [name] did have subsequent blood work, which showed normal LFTs, and chemistry.  Glucose was  minimally increased at 107.  Hemoglobin 11.9, hematocrit 36.1.   [name] has been tolerating Crestor for hyperlipidemia.  2 months after initiation of therapy, an NMR profile showed significant benefit with her total cholesterol being reduced from 255 to169, her LDL from 166 to 75, and her LDL particle number was excellent at 857.  Insulin resistance score was excellent.  Her HDL was 83 and triglycerides were 53.  Initially [name] did feel mild myalgias but these have resolved.  He denies any recent chest pain.   I saw her in 2016 which time [name] continued to do well.  [name] was taking classes and was doing well without shortness of breath.  However at other times [name] noted mild shortness of breath particularly with uphill walking.  Lab work in 2016 revealed her to be  minimally anemic with a hemoglobin of 11.6 and hematocrit 35.5.  Lipid studies remained excellent on Crestor with a total cholesterol 161, triglycerides 42, HDL 85, and LDL 68.  Her fasting glucose had improved and was now normal at 96.  [name] underwent extensive back surgery.  Previously [name] had undergone fusion of C4-5 and C5-6 approximately 16..  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  [name] tolerated neurosurgery well without card  of vascular compromise.    I last saw her in October 2018.  [name] underwent a 3 week trip to [place] where [name] was ambulating well.  [name] denied any chest pain or shortness of breath.  Specifically, there was no significant exertional angina.  [name] had blood work from her primary physician in August 2018.  Total cholesterol was 142, triglycerides 51, HDL 67, LDL 65, and non-HDL 75.  Chemistry profile was normal except glucose was minimally increased at 109.  Since I last saw  her, [name] was evaluated by Mammie Russian, PA on May 13, 2019.  That time [name] continued to do well.  Laboratory in August 2020 showed total cholesterol 151, HDL 73, LDL 68, triglycerides 56.  Continue to note some very mild shortness of breath with only very strenuous activity such as climbing uphill and denied any associated chest pain.  Presently, [name] has continued to be active and walks daily with her husband.  [name] walks fairly regularly without shortness of breath but again if there is uphill walking [name] may experience shortness of breath which improves with stopping and then [name] resumes walking.  [name] notes rare palpitations.  [name] continues to be on rosuvastatin 10 mg for hyperlipidemia and takes omega-3 fatty acid predominantly for dry eyes and eyedrops followed by Dr. Satira Sark.  [name] has continued to be on 81 mg aspirin.  On November 20, 2021 [name] established with a new [place] since Dr. Laurann Montana has retired.  [facility] from November 20, 2021 shows total cholesterol 159, HDL 76, LDL 75, triglycerides 40.  Creatinine 0.91.  Hemoglobin 11.5.  Potassium 4.5.  Transaminases normal.  [name] presents for follow-up evaluation.  Medical history is also notable for significant esophageal reflux for which [name] takes concomitant therapy with Zantac, Zegerid, and Prevacid.  [name] also has intermittent, irritable bowel symptoms, and a history of asthma.  Prior Surgical history is notable for hysterectomy in 1995 and cervical discectomy in 2004.  In 2018 [name] underwent redo surgery with Dr. Ellene Route with removal of previously placed hardware and decompression and fusion of C3-4, C6-7, and C7-T1.  Allergies  Allergen Reactions   Sulfa Antibiotics Other (See Comments)    Dyspnea and chest tightness   Other     [place] food (something in the spices) caused bronchospasms happens occasionally   Percocet [Oxycodone-Acetaminophen] Hives and Itching    Tolerates acetaminophen    Current Outpatient Medications  Medication Sig  Dispense Refill   albuterol (VENTOLIN HFA) 108 (90 Base) MCG/ACT inhaler Ventolin HFA 90 mcg/actuation aerosol inhaler  INHALE 1 PUFF EVERY 4 HOURS     aspirin 81 MG tablet Take 81 mg by mouth daily. Takes daily except when bleeding     carboxymethylcellulose (REFRESH PLUS) 0.5 % SOLN Apply to eye.     Cholecalciferol (VITAMIN D) 2000 UNITS CAPS Take 2,000 Units by mouth daily.      Coenzyme Q10 300 MG CAPS Take 300 mg by mouth daily.     Estradiol (YUVAFEM) 10 MCG TABS vaginal tablet Place 10 mcg vaginally 3 (three) times a week.     lansoprazole (PREVACID) 30 MG capsule Take 1 tablet by mouth daily.     LINZESS 290 MCG CAPS capsule Take 290 mcg by mouth daily.     magnesium oxide (MAG-OX) 400 MG tablet Take by mouth.     Omega-3 Fatty Acids (OMEGA-3 FISH OIL PO) Take 1 capsule by mouth daily.     Omeprazole-Sodium Bicarbonate (ZEGERID) 20-1100 MG CAPS capsule Take 1  capsule by mouth at bedtime.     Probiotic Product (ALIGN PO) Take 1 tablet by mouth daily.      ranitidine (ZANTAC) 150 MG tablet ranitidine 150 mg tablet     riboflavin (VITAMIN B-2) 100 MG TABS tablet Take 100 mg by mouth 2 (two) times daily.     rosuvastatin (CRESTOR) 10 MG tablet rosuvastatin 10 mg tablet     Lansoprazole (PREVACID PO) lansoprazole (Patient not taking: Reported on 12/06/2021)     linaclotide (LINZESS) 145 MCG CAPS capsule TAKE 1 CAPSULE (145 MCG TOTAL) BY MOUTH DAILY. (Patient not taking: Reported on 12/06/2021)     MAGNESIUM GLYCINATE PLUS PO Take 2 tablets by mouth at bedtime. (Patient not taking: Reported on 12/06/2021)     No current facility-administered medications for this visit.   Socially, [name] is married to [name] for 37 years.  [name] has 2 children who are both lawyers.  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  There is no tobacco history.  [name] rarely drinks alcohol.  [name] does exercise and walks regularly with her husband.  Family History  Problem Relation Age of Onset   Cancer  Mother    Breast cancer Mother    Hypertension Father    Diabetes Maternal Grandmother    Diabetes Paternal Grandfather        Pancreatic cancer   Breast cancer Cousin    Family history is also notable in that her father died with Parkinson's disease and had a possible cardiac arrest.  ROS General: Negative; No fevers, chills, or night sweats HEENT: Negative; No changes in vision or hearing, sinus congestion, difficulty swallowing Pulmonary: Positive for asthma; No cough, wheezing,hemoptysis Cardiovascular:  See HPI; no recent exertional dyspnea or chest pain.  No palpitations. GI: Positive for esophageal reflux;  No nausea, vomiting, diarrhea, or abdominal pain GU: Positive for hysterectomy No dysuria, hematuria, or difficulty voiding Musculoskeletal: Negative; no myalgias, joint pain, or weakness Hematologic/Oncologic: Negative; no easy bruising, bleeding Endocrine: Negative; no heat/cold intolerance; no diabetes Neuro: Status post neurosurgery for spinal low cysts/stenosis with myelopathy of her cervical vertebrae Skin: Negative; No rashes or skin lesions Psychiatric: Negative; No behavioral problems, depression Sleep: Negative; No daytime sleepiness, hypersomnolence, bruxism, restless legs, hypnogagnic hallucinations Other comprehensive 14 point system review is negative   Physical Exam BP 105/70 (BP Location: Left Arm, Patient Position: Sitting, Cuff Size: Normal)   Pulse 70   Ht 5' 1.5" (1.562 m)   Wt 105 lb 3.2 oz (47.7 kg)   SpO2 98%   BMI 19.56 kg/m    Repeate blood pressure by me was 102/70  Wt Readings from Last 3 Encounters:  12/06/21 105 lb 3.2 oz (47.7 kg)  05/13/19 105 lb (47.6 kg)  12/17/16 106 lb (48.1 kg)   General: Alert, oriented, no distress.  Skin: normal turgor, no rashes, warm and dry HEENT: Normocephalic, atraumatic. Pupils equal round and reactive to light; sclera anicteric; extraocular muscles intact;  Nose without nasal septal  hypertrophy Mouth/Parynx benign; Mallinpatti scale 2 Neck: No JVD, no carotid bruits; normal carotid upstroke Lungs: clear to ausculatation and percussion; no wheezing or rales Chest wall: without tenderness to palpitation Heart: PMI not displaced, RRR, s1 s2 normal, trivial systolic murmur, trivial diastolic murmur, no rubs, gallops, thrills, or heaves Abdomen: soft, nontender; no hepatosplenomehaly, BS+; abdominal aorta nontender and not dilated by palpation. Back: no CVA tenderness Pulses 2+ Musculoskeletal: full range of motion, normal strength, no joint deformities Extremities: no clubbing cyanosis or edema, Homan's  sign negative  Neurologic: grossly nonfocal; Cranial nerves grossly wnl Psychologic: Normal mood and affect  December 06, 2021 ECG (independently read by me): Normal sinus rhythm at 70 bpm.  Nonspecific ST abnormality.  Normal intervals.  No ectopy  December 17, 2016 ECG (independently read by me): Normal sinus rhythm at 78 bpm.  No ectopy.  Normal intervals.  No ST segment changes.  October 2017 ECG (independently read by me): Normal sinus rhythm at 62 bpm.  Normal intervals.  No ST segment changes.  September 2016 ECG (independently read by me): Normal sinus rhythm at 61 bpm.  Normal intervals.  No ST segment changes.  February 2016 ECG (independently read by me) : Normal sinus rhythm at 69 bpm.  No significant ST segment changes.  Normal intervals.    August 16, 2013 ECG (independently read by me): Normal sinus rhythm at 65 beats per minute.  PR interval normal at 124 Marcia.  QT 0.49 Marcia.  LABS:    Latest Ref Rng & Units 06/17/2016   12:36 PM 10/20/2014    9:26 AM 10/04/2013    9:40 AM  BMP  Glucose 65 - 99 mg/dL 107  96  107   BUN 6 - 20 mg/dL _0 Creatinine 0.44 - 1.00 mg/dL 0.78  0.71  0.70   Sodium 135 - 145 mmol/L 138  140  143   Potassium 3.5 - 5.1 mmol/L 4.0  4.5  5.1   Chloride 101 - 111 mmol/L 104  104  103   CO2 22 - 32 mmol/L _1 Calcium  8.9 - 10.3 mg/dL 9.5  9.3  9.6       Latest Ref Rng & Units 10/20/2014    9:26 AM 10/04/2013    9:40 AM 03/20/2010    6:58 PM  Hepatic Function  Total Protein 6.1 - 8.1 g/dL 6.8  7.3  7.7   Albumin 3.6 - 5.1 g/dL 4.0  3.8  4.0   AST 10 - 35 U/L _2 ALT 6 - 29 U/L _3 Alk Phosphatase 33 - 130 U/L 42  39  58   Total Bilirubin 0.2 - 1.2 mg/dL 0.5  0.4  0.6       Latest Ref Rng & Units 06/17/2016   12:36 PM 10/20/2014    9:06 AM 10/04/2013    9:40 AM  CBC  WBC 4.0 - 10.5 K/uL 7.4  5.5  6.6   Hemoglobin 12.0 - 15.0 g/dL 12.1  11.6  11.9   Hematocrit 36.0 - 46.0 % 37.4  35.5  36.1   Platelets 150 - 400 K/uL 270  256  254    Lab Results  Component Value Date   MCV 88.2 06/17/2016   MCV 89.4 10/20/2014   MCV 91.4 10/04/2013   Lab Results  Component Value Date   TSH 2.466 10/20/2014  No results found for: "HGBA1C"  Lipid Panel     Component Value Date/Time   CHOL 161 10/20/2014 0933   CHOL 169 10/08/2013 0911   TRIG 42 10/20/2014 0933   TRIG 53 10/08/2013 0911   HDL 85 10/20/2014 0933   HDL 83 10/08/2013 0911   CHOLHDL 1.9 10/20/2014 0933   VLDL 8 10/20/2014 0933   LDLCALC 68 10/20/2014 0933   LDLCALC 75 10/08/2013 0911    NMR Lipid Panel  10/08/2013:   Ref Range 56moago  LDL Particle Number <1000 nmol/L 857    Comments: Reference Range   Low < 1000   Moderate 1000 - 1299   Borderline-High 1300 - 1599   High 1600 - 2000   Very High > 2000     LDL (calc) <100 mg/dL 75    Comments: LDL-C is inaccurate if patient is nonfasting. Reference Range   Optimal < 100   Near/Above Optimal 100 - 129   Borderline High 130 - 159   High 160 - 189   Very High >= 190     HDL-C >=40 mg/dL 83    Triglycerides <150 mg/dL 53    Cholesterol, Total <200 mg/dL 169    HDL Particle Number >=30.5 umol/L 34.3    Large HDL-P >=4.8 umol/L 18.5    Large VLDL-P <=2.7 nmol/L < 0.8    Small LDL Particle Number <=527 nmol/L 96    LDL Size >20.5 nm 21.3    HDL Size  >=9.2 nm 10.5    VLDL Size nm See Note    Comments: VLDL concentration too low to allow determination of VLDL Size. Low VLDL concentration contributes minimally to the LP-IR score.    LP-IR Score <= 45  < 25        Laboratory from [place] on 09/27/2016 was reviewed.  BUN 23, creatinine 0.82.  Normal LFTs.  Normal electrolytes.  Lipid studies as noted above in history of present illness.  Laboratory from [place] on November 20, 2021 as noted above in HPI  RADIOLOGY: No results found.   IMPRESSION:  1. SOB (shortness of breath) with uphill walking   2. Pure hypercholesterolemia   3. Grade I diastolic dysfunction   4. Uncomplicated asthma, unspecified asthma severity, unspecified whether persistent   5. Gastroesophageal reflux disease without esophagitis    ASSESSMENT AND PLAN: Marcia  Lorenda Grecco is a 61 year-old female who has a history of significant GERD and takes Zegerid as well as lansoprazole and ranitidine.  [place], [name] developed significant, morning hoarseness as result of gastric reflux.  [name] has a history of mild asthma, for which he takes Proventil on an as-needed basis.  [name] has remained active but remotely had noticed shortness of breath particularly with walking steep hills.  These episodes would resolve promptly.  [name] also admits to a history of short-lived palpitations which would last seconds.  In 2015 [name] underwent a 2D echo Doppler study which showed normal LV systolic function with EF 55 to 60%.  There was grade 1 diastolic dysfunction and trivial aortic insufficiency.  [name] underwent a nuclear perfusion study which was low risk and revealed probable breast attenuation artifact although a minimum region of ischemia could not be completely excluded.  [name] had normal contractility with EF at 66%.  ECG was normal.  Probably [name] had significant hyperlipidemia with LDL cholesterols elevated at 166.  Since initiating statin therapy in 2015, lipid studies have been excellent  with LDL cholesterol was typically running around 75.  I reviewed her most recent laboratory done in [place] on November 20, 2021.  Total cholesterol was 159, HDL 76, LDL 75, and triglycerides 40.  Only, [name] continues to be active and walks regularly with her husband.  [name] still notes some shortness of breath if [name] goes up a steep hill which resolves with rest and then [name] can resume walking.  [name] notes rare palpitations.  [name] has been on omega-3 fatty acid for dry eyes.  [name] is on rosuvastatin 10 mg for  her lipids.  Since 8 years have elapsed since her echo Doppler study I discussed with her possible reevaluation with a echo cardiographic study for reassessment of both systolic and diastolic function.  [name] previously had mild diastolic grade 1 dysfunction.  I also have suggested a baseline calcium score assessment of coronary calcification particularly with her prior significant LDL elevation prior to 2015.  I discussed this during her office with her husband who also was on the telephone.  [name] and her husband will be traveling over the next several months.  I will see her in January for follow-up evaluation and further recommendations will be made at that time.    Troy Sine, MD, [facility] 12/06/2021 6:21 PM

## 2021-12-07 NOTE — Addendum Note (Signed)
Addended by: Orma Render on: 12/07/2021 02:35 PM   Modules accepted: Orders

## 2021-12-26 ENCOUNTER — Other Ambulatory Visit (HOSPITAL_COMMUNITY): Payer: BC Managed Care – PPO

## 2022-01-03 ENCOUNTER — Ambulatory Visit (HOSPITAL_COMMUNITY): Payer: BC Managed Care – PPO | Attending: Cardiovascular Disease

## 2022-01-03 DIAGNOSIS — R0602 Shortness of breath: Secondary | ICD-10-CM | POA: Diagnosis not present

## 2022-01-04 LAB — ECHOCARDIOGRAM COMPLETE
Area-P 1/2: 3.59 cm2
S' Lateral: 2.2 cm

## 2022-01-22 ENCOUNTER — Ambulatory Visit (HOSPITAL_BASED_OUTPATIENT_CLINIC_OR_DEPARTMENT_OTHER)
Admission: RE | Admit: 2022-01-22 | Discharge: 2022-01-22 | Disposition: A | Discharge status: Home / Self Care | Payer: BC Managed Care – PPO | Source: Ambulatory Visit | Attending: Cardiovascular Disease | Admitting: Cardiovascular Disease

## 2022-01-22 DIAGNOSIS — E7800 Pure hypercholesterolemia, unspecified: Secondary | ICD-10-CM | POA: Insufficient documentation

## 2022-02-12 ENCOUNTER — Encounter: Payer: Self-pay | Admitting: *Deleted

## 2022-03-20 ENCOUNTER — Other Ambulatory Visit: Payer: Self-pay | Admitting: Internal Medicine

## 2022-03-20 DIAGNOSIS — Z1231 Encounter for screening mammogram for malignant neoplasm of breast: Secondary | ICD-10-CM

## 2022-03-21 ENCOUNTER — Ambulatory Visit
Admission: RE | Admit: 2022-03-21 | Discharge: 2022-03-21 | Disposition: A | Discharge status: Home / Self Care | Payer: BC Managed Care – PPO | Source: Ambulatory Visit | Attending: Internal Medicine | Admitting: Internal Medicine

## 2022-03-21 DIAGNOSIS — Z1231 Encounter for screening mammogram for malignant neoplasm of breast: Secondary | ICD-10-CM

## 2022-04-02 DIAGNOSIS — Z6820 Body mass index (BMI) 20.0-20.9, adult: Secondary | ICD-10-CM | POA: Diagnosis not present

## 2022-04-02 DIAGNOSIS — Z1272 Encounter for screening for malignant neoplasm of vagina: Secondary | ICD-10-CM | POA: Diagnosis not present

## 2022-04-02 DIAGNOSIS — Z01419 Encounter for gynecological examination (general) (routine) without abnormal findings: Secondary | ICD-10-CM | POA: Diagnosis not present

## 2022-04-10 ENCOUNTER — Ambulatory Visit: Payer: BC Managed Care – PPO | Attending: Cardiovascular Disease | Admitting: Cardiovascular Disease

## 2022-04-10 VITALS — BP 110/74 | HR 71 | Ht 61.0 in | Wt 107.4 lb

## 2022-04-10 DIAGNOSIS — I5189 Other ill-defined heart diseases: Secondary | ICD-10-CM | POA: Diagnosis not present

## 2022-04-10 DIAGNOSIS — E7800 Pure hypercholesterolemia, unspecified: Secondary | ICD-10-CM | POA: Diagnosis not present

## 2022-04-10 DIAGNOSIS — J219 Acute bronchiolitis, unspecified: Secondary | ICD-10-CM

## 2022-04-10 DIAGNOSIS — M5412 Radiculopathy, cervical region: Secondary | ICD-10-CM

## 2022-04-10 DIAGNOSIS — J4520 Mild intermittent asthma, uncomplicated: Secondary | ICD-10-CM | POA: Diagnosis not present

## 2022-04-10 DIAGNOSIS — R0602 Shortness of breath: Secondary | ICD-10-CM

## 2022-04-10 DIAGNOSIS — E785 Hyperlipidemia, unspecified: Secondary | ICD-10-CM

## 2022-04-10 NOTE — Progress Notes (Signed)
Patient ID: [name], female   DOB: August 06, 1960, 62 y.o.   MRN: [id number]      Primary MD: Dr. Kelton Pillar (retired);   HPI:  [name] is a 62 y.o. female  who is the the wife of Dr. [name].  She established cardiology care with me in 2015 and I last saw her on December 06, 2021.  She presents for follow-up evaluation.  [name] denies any prior cardiac history.  She has remained fairly active and walks regularly with her husband.  In 2015 she noticed a definite change with development of more shortness of breath with activity and experienced a vague sensation of chest tightness which can occur at night.  She does not believe this is due to asthma.  She also has noticed some occasional palpitations.  She saw Dr. Laurann Montana on 08/04/2013 and saw me for initial cardiology evaluation shortly thereafter.  A 2-D echo Doppler study on 09/01/2013 showed an ejection fraction of 55-60%.  She had normal wall motion without regional wall motion abnormalities.  Doppler parameters are consistent with mild grade 1 diastolic dysfunction.  There was trivial aortic insufficiency.  She underwent a nuclear perfusion study and exercise for 10 minutes and 21 seconds.  She stopped secondary to fatigue.  There were no diagnostic ST changes.  It was reported that she had a mild hypertensive response to exercise.  There was a mild defect  in the anteroapical region and it was felt most likely that this was breast attenuation artifact.  However, a minimal region of ischemia could not be completely excluded.  She had normal contractility with an ejection fraction of 66%.  When I saw her I recommended initiation of statin therapy in light of her lipid panel done by Dr. Coral Spikes in June 2015 which showed a total cholesterol 255, triglycerides 52, HDL 79, but an LDL of 166.  She has been taking Crestor 10 mg.  She did have subsequent blood work, which showed normal LFTs, and chemistry.  Glucose was  minimally increased at 107.  Hemoglobin 11.9, hematocrit 36.1.   She has been tolerating Crestor for hyperlipidemia.  2 months after initiation of therapy, an NMR profile showed significant benefit with her total cholesterol being reduced from 255 to169, her LDL from 166 to 75, and her LDL particle number was excellent at 857.  Insulin resistance score was excellent.  Her HDL was 83 and triglycerides were 53.  Initially she did feel mild myalgias but these have resolved.  He denies any recent chest pain.   I saw her in 2016 which time she continued to do well.  She was taking classes and was doing well without shortness of breath.  However at other times she noted mild shortness of breath particularly with uphill walking.  Lab work in 2016 revealed her to be  minimally anemic with a hemoglobin of 11.6 and hematocrit 35.5.  Lipid studies remained excellent on Crestor with a total cholesterol 161, triglycerides 42, HDL 85, and LDL 68.  Her fasting glucose had improved and was now normal at 96.  She underwent extensive back surgery.  Previously she had undergone fusion of C4-5 and C5-6 approximately 16..  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  She tolerated neurosurgery well without card  of vascular compromise.    I saw her in October 2018.  She underwent a 3 week trip to [place] where she was ambulating well.  She denied any chest pain or shortness of breath.  Specifically, there was no significant exertional angina.  She had blood work from her primary physician in August 2018.  Total cholesterol was 142, triglycerides 51, HDL 67, LDL 65, and non-HDL 75.  Chemistry profile was normal except glucose was minimally increased at 109.  She  was evaluated by Almyra Deforest, PA on May 13, 2019 and continued to do well.  Laboratory in August 2020 showed total cholesterol 151, HDL 73, LDL 68, triglycerides 56.  Continue to note some very mild shortness of breath with only very strenuous activity such as climbing uphill and denied any associated chest pain.  I last saw her on December 06, 2021.  She  continued to be active and walks daily with her husband.  She walks fairly regularly without shortness of breath but again if there is uphill walking she may experience shortness of breath which improves with stopping and then she resumes walking.  She notes rare palpitations.  She continues to be on rosuvastatin 10 mg for hyperlipidemia and takes omega-3 fatty acid predominantly for dry eyes and eyedrops followed by Dr. Satira Sark.  She has continued to be on 81 mg aspirin.  On November 20, 2021 she established with a new [facility] since Dr. Laurann Montana has retired.  Laboratory from November 20, 2021 shows total cholesterol 159, HDL 76, LDL 75, triglycerides 40.  Creatinine 0.91.  Hemoglobin 11.5.  Potassium 4.5.  Transaminases normal.  During that evaluation, since 8 years had a lap since her echo Doppler study I discussed possible reevaluation and also recommended a baseline calcium score for assessment of coronary calcification particularly with her significant LDL elevation prior to 2015.  An echo Doppler study was done on January 13, 2022 and showed hyperdynamic LV function with EF 65 to 70%.  There was grade 1 diastolic dysfunction.  She had normal LV strain.  RV systolic pressure was normal at 23.2.  There was mild mitral regurgitation.  A coronary calcium score was done on January 22, 2022 was excellent with a calcium score of 0.  Her chest CT over read showed subtle.  Bronchovascular nodularity within the left lower lobe and lingula.  It was felt most likely reflective of an infectious or inflammatory bronchiolitis.  If patient was high risk, a noncontrast chest CT can  be considered in 12 months.  Presently, [name] continues to feel well.  She continues to exercise regularly.  She denies chest pain.  She still experiences some mild shortness of breath with fast uphill walking which improves with rest.  She continues to be on rosuvastatin 10 mg and omega-3 fatty acid over-the-counter for hyperlipidemia.  She is on aspirin 81 mg.  She takes Zegerid and H2 blocker therapy for GERD.  She has Ventolin as needed for.  She is here with her husband.    Medical history is also notable for significant esophageal reflux for which she takes concomitant therapy with Zantac, Zegerid, and Prevacid.  She also has intermittent, irritable bowel symptoms, and a history of asthma.  Prior Surgical history is notable for hysterectomy in 1995 and cervical discectomy in 2004.  In 2018 she underwent redo surgery with Dr. Ellene Route with removal of previously placed hardware and decompression and fusion of C3-4, C6-7, and C7-T1.  Allergies  Allergen Reactions  Sulfa Antibiotics Other (See Comments)    Dyspnea and chest tightness   Other     [place] food (something in the spices) caused bronchospasms happens occasionally   Percocet [Oxycodone-Acetaminophen] Hives and Itching    Tolerates acetaminophen    Current Outpatient Medications  Medication Sig Dispense Refill   albuterol (VENTOLIN HFA) 108 (90 Base) MCG/ACT inhaler Ventolin HFA 90 mcg/actuation aerosol inhaler  INHALE 1 PUFF EVERY 4 HOURS     albuterol (VENTOLIN HFA) 108 (90 Base) MCG/ACT inhaler 1 puff as needed Inhalation every 4 hrs     aspirin 81 MG chewable tablet 1 tablet Orally Once a day     aspirin 81 MG tablet Take 81 mg by mouth daily. Takes daily except when bleeding     carboxymethylcellulose (REFRESH PLUS) 0.5 % SOLN Apply to eye.     Cholecalciferol (VITAMIN D) 2000 UNITS CAPS Take 2,000 Units by mouth daily.      Cholecalciferol 50 MCG (2000 UT) CAPS 1 tablet Orally Once a day     Coenzyme Q10 300 MG CAPS Take  300 mg by mouth daily.     Estradiol (YUVAFEM) 10 MCG TABS vaginal tablet Place 10 mcg vaginally 3 (three) times a week.     Lansoprazole (PREVACID PO)      lansoprazole (PREVACID) 30 MG capsule Take 1 tablet by mouth daily.     linaclotide (LINZESS) 145 MCG CAPS capsule      LINZESS 290 MCG CAPS capsule Take 290 mcg by mouth daily.     magnesium oxide (MAG-OX) 400 MG tablet Take by mouth.     Omega-3 Fatty Acids (OMEGA-3 FISH OIL PO) Take 1 capsule by mouth daily.     Omeprazole-Sodium Bicarbonate (ZEGERID) 20-1100 MG CAPS capsule Take 1 capsule by mouth at bedtime.     Probiotic Product (ALIGN PO) Take 1 tablet by mouth daily.      ranitidine (ZANTAC) 150 MG tablet ranitidine 150 mg tablet     riboflavin (VITAMIN B-2) 100 MG TABS tablet Take 100 mg by mouth 2 (two) times daily.     rosuvastatin (CRESTOR) 10 MG tablet rosuvastatin 10 mg tablet     MAGNESIUM GLYCINATE PLUS PO Take 2 tablets by mouth at bedtime. (Patient not taking: Reported on 04/10/2022)     No current facility-administered medications for this visit.   Socially, she is married to Dr. [name] for 37 years.  She has 2 children who are both lawyers.  She previously was as a Management consultant.  There is no tobacco history.  She rarely drinks alcohol.  She does exercise and walks regularly with her husband.  Family History  Problem Relation Age of Onset   Cancer Mother    Breast cancer Mother    Hypertension Father    Diabetes Maternal Grandmother    Diabetes Paternal Grandfather        Pancreatic cancer   Breast cancer Cousin    Family history is also notable in that her father died with Parkinson's disease and had a possible cardiac arrest.  ROS General: Negative; No fevers, chills, or night sweats HEENT: Negative; No changes in vision or hearing, sinus congestion, difficulty swallowing Pulmonary: Positive for asthma; No cough, wheezing,hemoptysis Cardiovascular:  See HPI; no recent exertional dyspnea or chest  pain.  No palpitations. GI: Positive for esophageal reflux;  No nausea, vomiting, diarrhea, or abdominal pain GU: Positive for hysterectomy No dysuria, hematuria, or difficulty voiding Musculoskeletal: Negative; no myalgias, joint pain, or weakness Hematologic/Oncologic: Negative;  no easy bruising, bleeding Endocrine: Negative; no heat/cold intolerance; no diabetes Neuro: Status post neurosurgery for spinal low cysts/stenosis with myelopathy of her cervical vertebrae Skin: Negative; No rashes or skin lesions Psychiatric: Negative; No behavioral problems, depression Sleep: Negative; No daytime sleepiness, hypersomnolence, bruxism, restless legs, hypnogagnic hallucinations Other comprehensive 14 point system review is negative   Physical Exam BP 110/74   Pulse 71   Ht 5' 1"$  (1.549 m)   Wt 107 lb 6.4 oz (48.7 kg)   BMI 20.29 kg/m    Repeate blood pressure by me was 106/66  Wt Readings from Last 3 Encounters:  04/10/22 107 lb 6.4 oz (48.7 kg)  12/06/21 105 lb 3.2 oz (47.7 kg)  05/13/19 105 lb (47.6 kg)   General: Alert, oriented, no distress.  Skin: normal turgor, no rashes, warm and dry HEENT: Normocephalic, atraumatic. Pupils equal round and reactive to light; sclera anicteric; extraocular muscles intact;  Nose without nasal septal hypertrophy Mouth/Parynx benign; Mallinpatti scale 2 Neck: No JVD, no carotid bruits; normal carotid upstroke Lungs: clear to ausculatation and percussion; no wheezing or rales Chest wall: without tenderness to palpitation Heart: PMI not displaced, RRR, s1 s2 normal, trivial 1/6 apical systolic murmur, no diastolic murmur, no rubs, gallops, thrills, or heaves Abdomen: soft, nontender; no hepatosplenomehaly, BS+; abdominal aorta nontender and not dilated by palpation. Back: no CVA tenderness Pulses 2+ Musculoskeletal: full range of motion, normal strength, no joint deformities Extremities: no clubbing cyanosis or edema, Homan's sign negative   Neurologic: grossly nonfocal; Cranial nerves grossly wnl Psychologic: Normal mood and affect    April 10, 2022 ECG (independently read by me): NSR at 79, no ST changes, no ectopy  December 06, 2021 ECG (independently read by me): Normal sinus rhythm at 70 bpm.  Nonspecific ST abnormality.  Normal intervals.  No ectopy  December 17, 2016 ECG (independently read by me): Normal sinus rhythm at 78 bpm.  No ectopy.  Normal intervals.  No ST segment changes.  October 2017 ECG (independently read by me): Normal sinus rhythm at 62 bpm.  Normal intervals.  No ST segment changes.  September 2016 ECG (independently read by me): Normal sinus rhythm at 61 bpm.  Normal intervals.  No ST segment changes.  February 2016 ECG (independently read by me) : Normal sinus rhythm at 69 bpm.  No significant ST segment changes.  Normal intervals.    August 16, 2013 ECG (independently read by me): Normal sinus rhythm at 65 beats per minute.  PR interval normal at 124 Marcia.  QT 0.49 Marcia.  LABS:    Latest Ref Rng & Units 06/17/2016   12:36 PM 10/20/2014    9:26 AM 10/04/2013    9:40 AM  BMP  Glucose 65 - 99 mg/dL 107  96  107   BUN 6 - 20 mg/dL 19  17  21   $ Creatinine 0.44 - 1.00 mg/dL 0.78  0.71  0.70   Sodium 135 - 145 mmol/L 138  140  143   Potassium 3.5 - 5.1 mmol/L 4.0  4.5  5.1   Chloride 101 - 111 mmol/L 104  104  103   CO2 22 - 32 mmol/L 26  30  28   $ Calcium 8.9 - 10.3 mg/dL 9.5  9.3  9.6       Latest Ref Rng & Units 10/20/2014    9:26 AM 10/04/2013    9:40 AM 03/20/2010    6:58 PM  Hepatic Function  Total Protein 6.1 - 8.1 g/dL  6.8  7.3  7.7   Albumin 3.6 - 5.1 g/dL 4.0  3.8  4.0   AST 10 - 35 U/L 16  15  25   $ ALT 6 - 29 U/L 11  18  20   $ Alk Phosphatase 33 - 130 U/L 42  39  58   Total Bilirubin 0.2 - 1.2 mg/dL 0.5  0.4  0.6       Latest Ref Rng & Units 06/17/2016   12:36 PM 10/20/2014    9:06 AM 10/04/2013    9:40 AM  CBC  WBC 4.0 - 10.5 K/uL 7.4  5.5  6.6   Hemoglobin 12.0 - 15.0 g/dL 12.1   11.6  11.9   Hematocrit 36.0 - 46.0 % 37.4  35.5  36.1   Platelets 150 - 400 K/uL 270  256  254    Lab Results  Component Value Date   MCV 88.2 06/17/2016   MCV 89.4 10/20/2014   MCV 91.4 10/04/2013   Lab Results  Component Value Date   TSH 2.466 10/20/2014  No results found for: "HGBA1C"  Lipid Panel     Component Value Date/Time   CHOL 161 10/20/2014 0933   CHOL 169 10/08/2013 0911   TRIG 42 10/20/2014 0933   TRIG 53 10/08/2013 0911   HDL 85 10/20/2014 0933   HDL 83 10/08/2013 0911   CHOLHDL 1.9 10/20/2014 0933   VLDL 8 10/20/2014 0933   LDLCALC 68 10/20/2014 0933   LDLCALC 75 10/08/2013 0911    NMR Lipid Panel  10/08/2013:   Ref Range 53moago     LDL Particle Number <1000 nmol/L 857    Comments: Reference Range   Low < 1000   Moderate 1000 - 1299   Borderline-High 1300 - 1599   High 1600 - 2000   Very High > 2000     LDL (calc) <100 mg/dL 75    Comments: LDL-C is inaccurate if patient is nonfasting. Reference Range   Optimal < 100   Near/Above Optimal 100 - 129   Borderline High 130 - 159   High 160 - 189   Very High >= 190     HDL-C >=40 mg/dL 83    Triglycerides <150 mg/dL 53    Cholesterol, Total <200 mg/dL 169    HDL Particle Number >=30.5 umol/L 34.3    Large HDL-P >=4.8 umol/L 18.5    Large VLDL-P <=2.7 nmol/L < 0.8    Small LDL Particle Number <=527 nmol/L 96    LDL Size >20.5 nm 21.3    HDL Size >=9.2 nm 10.5    VLDL Size nm See Note    Comments: VLDL concentration too low to allow determination of VLDL Size. Low VLDL concentration contributes minimally to the LP-IR score.    LP-IR Score <= 45  < 25        [place] [facility] [place] was reviewed.  BUN 23, creatinine 0.82.  Normal LFTs.  Normal electrolytes.  Lipid studies as noted above in history of present illness.  [facility] [place] 2021 as noted above in HPI  RADIOLOGY: No results found.  ECHO: 01/03/2022  1. Left ventricular ejection  fraction, by estimation, is 65 to 70%. Left  ventricular ejection fraction by 3D volume is 67 %. The left ventricle has  normal function. The left ventricle has no regional wall motion  abnormalities. Left ventricular diastolic   parameters are consistent with Grade I diastolic dysfunction (impaired  relaxation). The average left ventricular global longitudinal strain is  -25.3 %. The global longitudinal strain is normal.   2. Right ventricular systolic function is normal. The right ventricular  size is normal. There is normal pulmonary artery systolic pressure. The  estimated right ventricular systolic pressure is [id number] mmHg.   3. The mitral valve is normal in structure. Mild mitral valve  regurgitation. No evidence of mitral stenosis.   4. The aortic valve is tricuspid. Aortic valve regurgitation is not  visualized. No aortic stenosis is present.   5. The inferior vena cava is normal in size with greater than 50%  respiratory variability, suggesting right atrial pressure of 3 mmHg.    CT CARDIAC SCORE: 01/03/2022 FINDINGS: Coronary arteries: Normal origins.   Coronary Calcium Score:   Left main:   Left anterior descending artery:   Left circumflex artery:   Right coronary artery:   Total: 0   Percentile:   Pericardium: Normal.   Ascending Aorta: Normal caliber.   Non-cardiac: See separate report from [facility].   IMPRESSION: Coronary calcium score of 0.   OVER-READ INTERPRETATION CT CHEST  IMPRESSION: Subtle peribronchovascular nodularity within the left lower lobe and lingula, likely reflective of an infectious or inflammatory bronchiolitis. No follow-up needed if patient is low-risk (and has no known or suspected primary neoplasm). Non-contrast chest CT can be considered in 12 months if patient is high-risk. This recommendation follows the consensus statement: Guidelines for Management of Incidental Pulmonary Nodules Detected on CT Images: From the  Fleischner Society 2017; Radiology 2017; 284:228-243.  IMPRESSION:  1. Pure hypercholesterolemia   2. Bronchiolitis   3. Grade I diastolic dysfunction   4. Mild intermittent asthma without complication   5. SOB (shortness of breath) with uphill walking   6. Cervical radiculopathy    ASSESSMENT AND PLAN: [name] is a 62 year-old female who has a history of significant GERD and takes Zegerid as well as lansoprazole and ranitidine.  Remotey, she developed significant, morning hoarseness as result of gastric reflux.  She has a history of mild asthma, for which he takes Proventil on an as-needed basis.  She has remained active but remotely had noticed shortness of breath particularly with walking steep hills.  These episodes would resolve promptly.  She also admits to a history of short-lived palpitations which would last seconds.  In 2015 an 2D echo Doppler study  showeded normal LV systolic function with EF 55 to 60%.  There was grade 1 diastolic dysfunction and trivial aortic insufficiency.  She underwent a nuclear perfusion study which was low risk and revealed probable breast attenuation artifact although a minimum region of ischemia could not be completely excluded.  She had normal contractility with EF at 66%.  ECG was normal.  She had significant hyperlipidemia with LDL cholesterols elevated at 166.  Since initiating statin therapy in 2015, lipid studies have been excellent with LDL cholesterol was typically running around 75.  I reviewed laboratory done in [place] on November 20, 2021.  Total cholesterol was 159, HDL 76, LDL 75, and triglycerides 40.  Only, she continues to be active and walks regularly with her husband.  She still notes some shortness of breath if she goes up a steep hill which resolves with rest and then she can resume walking.  She notes rare palpitations.  She has been on omega-3 fatty acid for dry eyes.  She is on rosuvastatin 10 mg for her lipids.  When I saw her in  October 2023 since 8 years have elapsed since her echo Doppler study I discussed reevaluation with echocardiography and also recommended baseline CT cardiac scoring.  Her echo Doppler study remains unchanged and shows hyperdynamic LV systolic function with EF at 65 to 70%.  There was grade 1 diastolic dysfunction and mild MR.  Her coronary calcium score is excellent at 0.  Incidentally, she was found to have subtle peribronchial vascular nodule in July ready within this superomedial aspect of the left lower lobe and within the lingula with the largest nodule measuring 4 mm in the left lower lobe.  She is low risk.  If high risk, noncontrast chest CT was recommended in 1 year.  I discussed this with both Dr. And Mrs. Knodel main.  They would prefer a 1 year follow-up evaluation.  As result I will schedule this to be done in February 2025 after her [birth year] birthday since she will then be on Medicare and I will see her back in follow-up in 1 year for further evaluation.  Her blood pressure continues to be excellent.  She is tolerating rosuvastatin 10 mg and omega-3 fatty acid with LDL cholesterol in September 2023 at 60.  I will see her in 1 year for reevaluation.     Troy Sine, MD, [facility] 04/12/2022 10:33 AM

## 2022-04-10 NOTE — Patient Instructions (Signed)
Medication Instructions:  The current medical regimen is effective;  continue present plan and medications.  *If you need a refill on your cardiac medications before your next appointment, please call your pharmacy*   Testing/Procedures: NON CONTRAST CT in February 2025   Follow-Up: At [facility], you and your health needs are our priority.  As part of our continuing mission to provide you with exceptional heart care, we have created designated Provider Care Teams.  These Care Teams include your primary Cardiologist (physician) and Advanced Practice Providers (APPs -  Physician Assistants and Nurse Practitioners) who all work together to provide you with the care you need, when you need it.  We recommend signing up for the patient portal called "MyChart".  Sign up information is provided on this After Visit Summary.  MyChart is used to connect with patients for Virtual Visits (Telemedicine).  Patients are able to view lab/test results, encounter notes, upcoming appointments, etc.  Non-urgent messages can be sent to your provider as well.   To learn more about what you can do with MyChart, go to [email or web address].    Your next appointment:   March 2025   Provider:   Shelva Majestic, MD

## 2022-04-12 ENCOUNTER — Encounter: Payer: Self-pay | Admitting: Cardiovascular Disease

## 2022-04-23 DIAGNOSIS — K5904 Chronic idiopathic constipation: Secondary | ICD-10-CM | POA: Diagnosis not present

## 2022-07-08 DIAGNOSIS — H2513 Age-related nuclear cataract, bilateral: Secondary | ICD-10-CM | POA: Diagnosis not present

## 2022-07-08 DIAGNOSIS — H04123 Dry eye syndrome of bilateral lacrimal glands: Secondary | ICD-10-CM | POA: Diagnosis not present

## 2022-07-08 DIAGNOSIS — H40013 Open angle with borderline findings, low risk, bilateral: Secondary | ICD-10-CM | POA: Diagnosis not present

## 2022-07-08 DIAGNOSIS — H02054 Trichiasis without entropian left upper eyelid: Secondary | ICD-10-CM | POA: Diagnosis not present

## 2022-07-08 DIAGNOSIS — H52203 Unspecified astigmatism, bilateral: Secondary | ICD-10-CM | POA: Diagnosis not present

## 2022-07-08 DIAGNOSIS — H524 Presbyopia: Secondary | ICD-10-CM | POA: Diagnosis not present

## 2022-09-27 DIAGNOSIS — H9201 Otalgia, right ear: Secondary | ICD-10-CM | POA: Diagnosis not present

## 2022-09-27 DIAGNOSIS — H6990 Unspecified Eustachian tube disorder, unspecified ear: Secondary | ICD-10-CM | POA: Diagnosis not present

## 2022-11-06 DIAGNOSIS — D2262 Melanocytic nevi of left upper limb, including shoulder: Secondary | ICD-10-CM | POA: Diagnosis not present

## 2022-11-06 DIAGNOSIS — D225 Melanocytic nevi of trunk: Secondary | ICD-10-CM | POA: Diagnosis not present

## 2022-11-06 DIAGNOSIS — D2261 Melanocytic nevi of right upper limb, including shoulder: Secondary | ICD-10-CM | POA: Diagnosis not present

## 2022-11-06 DIAGNOSIS — L821 Other seborrheic keratosis: Secondary | ICD-10-CM | POA: Diagnosis not present

## 2022-11-22 DIAGNOSIS — Z0000 Encounter for general adult medical examination without abnormal findings: Secondary | ICD-10-CM | POA: Diagnosis not present

## 2022-11-22 DIAGNOSIS — D649 Anemia, unspecified: Secondary | ICD-10-CM | POA: Diagnosis not present

## 2022-11-22 DIAGNOSIS — E785 Hyperlipidemia, unspecified: Secondary | ICD-10-CM | POA: Diagnosis not present

## 2022-11-23 ENCOUNTER — Other Ambulatory Visit: Payer: Self-pay | Admitting: Internal Medicine

## 2022-11-23 DIAGNOSIS — M8580 Other specified disorders of bone density and structure, unspecified site: Secondary | ICD-10-CM

## 2022-11-25 ENCOUNTER — Other Ambulatory Visit: Payer: Self-pay | Admitting: Internal Medicine

## 2022-11-25 DIAGNOSIS — Z1231 Encounter for screening mammogram for malignant neoplasm of breast: Secondary | ICD-10-CM

## 2022-11-25 DIAGNOSIS — M8580 Other specified disorders of bone density and structure, unspecified site: Secondary | ICD-10-CM

## 2022-12-02 ENCOUNTER — Encounter: Payer: Self-pay | Admitting: Internal Medicine

## 2023-01-20 DIAGNOSIS — H2513 Age-related nuclear cataract, bilateral: Secondary | ICD-10-CM | POA: Diagnosis not present

## 2023-01-20 DIAGNOSIS — H5310 Unspecified subjective visual disturbances: Secondary | ICD-10-CM | POA: Diagnosis not present

## 2023-02-28 DIAGNOSIS — H5310 Unspecified subjective visual disturbances: Secondary | ICD-10-CM | POA: Diagnosis not present

## 2023-03-03 ENCOUNTER — Encounter: Payer: Self-pay | Admitting: Cardiovascular Disease

## 2023-03-03 NOTE — Telephone Encounter (Signed)
 Error

## 2023-03-10 ENCOUNTER — Telehealth: Payer: Self-pay | Admitting: Cardiovascular Disease

## 2023-03-10 ENCOUNTER — Encounter: Payer: Self-pay | Admitting: Cardiovascular Disease

## 2023-03-10 ENCOUNTER — Other Ambulatory Visit: Payer: Self-pay

## 2023-03-10 DIAGNOSIS — J219 Acute bronchiolitis, unspecified: Secondary | ICD-10-CM

## 2023-03-10 NOTE — Telephone Encounter (Signed)
 Called and spoke with patient and spouse. I have extended the end date on the order for the chest CT to April 26, 2023 and informed them they can now call and get this test scheduled.

## 2023-03-10 NOTE — Telephone Encounter (Signed)
 Error

## 2023-03-10 NOTE — Telephone Encounter (Signed)
 Patient wants to get order for CT Chest w/o Contrast extended past February 22 as she will be out of town January 31 through February 22.  Patient wants a call back to confirm order has been extended so she can get the test scheduled.

## 2023-03-13 ENCOUNTER — Telehealth: Payer: Self-pay

## 2023-03-13 NOTE — Telephone Encounter (Signed)
Closing encounter note, no action needed

## 2023-03-26 ENCOUNTER — Ambulatory Visit: Payer: BC Managed Care – PPO

## 2023-04-21 ENCOUNTER — Ambulatory Visit (HOSPITAL_BASED_OUTPATIENT_CLINIC_OR_DEPARTMENT_OTHER)
Admission: RE | Admit: 2023-04-21 | Discharge: 2023-04-21 | Disposition: A | Discharge status: Home / Self Care | Payer: Medicare Other | Source: Ambulatory Visit | Attending: Cardiovascular Disease | Admitting: Cardiovascular Disease

## 2023-04-21 DIAGNOSIS — K2289 Other specified disease of esophagus: Secondary | ICD-10-CM | POA: Insufficient documentation

## 2023-04-21 DIAGNOSIS — J219 Acute bronchiolitis, unspecified: Secondary | ICD-10-CM | POA: Insufficient documentation

## 2023-04-21 DIAGNOSIS — R918 Other nonspecific abnormal finding of lung field: Secondary | ICD-10-CM | POA: Diagnosis not present

## 2023-04-22 ENCOUNTER — Encounter: Payer: Self-pay | Admitting: Genetic Counselor

## 2023-04-22 ENCOUNTER — Inpatient Hospital Stay: Payer: Medicare Other | Attending: Genetic Counselor | Admitting: Genetic Counselor

## 2023-04-22 DIAGNOSIS — Z803 Family history of malignant neoplasm of breast: Secondary | ICD-10-CM | POA: Diagnosis not present

## 2023-04-22 DIAGNOSIS — Z801 Family history of malignant neoplasm of trachea, bronchus and lung: Secondary | ICD-10-CM

## 2023-04-22 DIAGNOSIS — Z800 Family history of malignant neoplasm of digestive organs: Secondary | ICD-10-CM | POA: Diagnosis not present

## 2023-04-22 DIAGNOSIS — Z1379 Encounter for other screening for genetic and chromosomal anomalies: Secondary | ICD-10-CM

## 2023-04-22 DIAGNOSIS — Z808 Family history of malignant neoplasm of other organs or systems: Secondary | ICD-10-CM

## 2023-04-22 NOTE — Progress Notes (Signed)
 REFERRING PROVIDER: No referring provider defined for this encounter.  PRIMARY PROVIDER:  Ollen Bowl, MD  PRIMARY REASON FOR VISIT:  1. Family history of breast cancer   2. Family history of pancreatic cancer    I connected with Ms. Holness on 04/22/2023 at 3:45pm ET by telephone and verified that I am speaking with the correct person using two identifiers.   Patient location: home Provider location: [facility] office   HISTORY OF PRESENT ILLNESS:   Ms. Vidana, a 63 y.o. female, was seen for a [facility] cancer genetics consultation due to a family history of breast cancer, pancreatic cancer, and Ashkenazi Jewish ancestry.  Ms. Rallis presents to clinic today to discuss the possibility of a hereditary predisposition to cancer, to discuss genetic testing, and to further clarify her future cancer risks, as well as potential cancer risks for family members.   Ms. Alfieri is a 63 y.o. female with no personal history of cancer.    CANCER HISTORY:  Oncology History   No history exists.     SCREENING/RISK FACTORS:  Mammogram within the last year: yes; category C density  Number of breast biopsies: 0. Colonoscopy: yes;  no polyps per patient . Hysterectomy: yes at age 68 due to fibroids. Ovaries intact: yes.  Menarche was at age 78.  First live birth at age 84.  Menopausal status: postmenopausal.  OCP use for approximately 0 years.  HRT use: vaginal estrogen only    Past Medical History:  Diagnosis Date   Arthritis    Asthma    asthmatic bronchitis   Dyspnea    on exertion   GERD (gastroesophageal reflux disease)    Heart murmur     Past Surgical History:  Procedure Laterality Date   ABDOMINAL HYSTERECTOMY     ANTERIOR CERVICAL DECOMP/DISCECTOMY FUSION N/A 06/25/2016   Procedure: CERVICAL THREE- CERVICAL FOUR, CERVICAL SIX- CERVICAL SEVEN, CERVICAL SEVEN- THORACIC ONE  ANTERIOR CERVICAL DECOMPRESSION/DISCECTOMY FUSION, REMOVAL OF ANTERIOR CERVICAL PLATE CERVICAL  FOUR- CERVICAL SIX;  Surgeon: Barnett Abu, MD;  Location: [facility] OR;  Service: Neurosurgery;  Laterality: N/A;   CERVICAL SPINE SURGERY     4-5 :5-6 cervical fusion sixteen yrs ago     FAMILY HISTORY:  We obtained a detailed, 4-generation family history.  Significant diagnoses are listed below: Family History  Problem Relation Age of Onset   Breast cancer Mother        dx 6; dx 29; DCIS; two primaries; ER+   Hypertension Father    Lung cancer Maternal Uncle 76       smoking hx   Diabetes Maternal Grandmother    Brain cancer Maternal Grandmother 40       mets from other site? ?melanoma   Diabetes Paternal Grandfather    Pancreatic cancer Paternal Grandfather 38   Breast cancer Cousin 32       mat female cousin   Brain cancer Other        glioblastoma in distant paternal cousin       Ms. Mcclay stated that her mother had genetic testing for BRCA1/2 only more than 15 years ago.  No report was available for review today.  She is unaware of other family history of hereditary cancer genetic testing.  Other relatives are unavailable for genetic testing at this time.     There is reported maternal and paternal Ashkenazi Jewish ancestry.   GENETIC COUNSELING ASSESSMENT: Ms. Bartling is a 63 y.o. female with a family history of cancer which is somewhat  suggestive of a hereditary cancer syndrome and predisposition to cancer given her mother's history of two primary breast cancers, her paternal grandfather's history of pancreatic cancer, and the reported Ashkenazi Jewish ancestry. We, therefore, discussed and recommended the following at today's visit.   DISCUSSION: We discussed that 5 - 10% of cancer is hereditary, with most cases of hereditary breast or pancreatic cancer associated with mutations in BRCA1/2.  There are other genes that can be associated with hereditary breast, pancreatic, or other cancer syndromes.  We discussed that testing is beneficial for several reasons, including  knowing about other cancer risks, identifying potential screening and risk-reduction options that may be appropriate, and understanding if other family members could be at an increased risk for cancer and allowing them to undergo genetic testing.  We reviewed the characteristics, features and inheritance patterns of hereditary cancer syndromes. We also discussed genetic testing, including the appropriate family members to test, the process of testing, insurance coverage and turn-around-time for results. We discussed the implications of a negative, positive, and variant of uncertain significant result. Given she does not have a personal history of cancer, negative results would be uninformative. We recommended Ms. Lubas pursue genetic testing for a panel that contains genes associated with breast cancer, pancreatic cancer, brain cancer, melanoma, and other cancers.  The CancerNext-Expanded gene panel offered by [facility] and includes sequencing, rearrangement, and RNA analysis for the following 76 genes: AIP, ALK, APC, ATM, AXIN2, BAP1, BARD1, BMPR1A, BRCA1, BRCA2, BRIP1, CDC73, CDH1, CDK4, CDKN1B, CDKN2A, CEBPA, CHEK2, CTNNA1, DDX41, DICER1, ETV6, FH, FLCN, GATA2, LZTR1, MAX, MBD4, MEN1, MET, MLH1, MSH2, MSH3, MSH6, MUTYH, NF1, NF2, NTHL1, PALB2, PHOX2B, PMS2, POT1, PRKAR1A, PTCH1, PTEN, RAD51C, RAD51D, RB1, RET, RUNX1, SDHA, SDHAF2, SDHB, SDHC, SDHD, SMAD4, SMARCA4, SMARCB1, SMARCE1, STK11, SUFU, TMEM127, TP53, TSC1, TSC2, VHL, and WT1 (sequencing and deletion/duplication); EGFR, HOXB13, KIT, MITF, PDGFRA, POLD1, and POLE (sequencing only); EPCAM and GREM1 (deletion/duplication only).   Based on Ms. Urbieta's family history of cancer (two breast cancers in her mother, pancreatic cancer in paternal grandfather, and Ashkenazi Jewish ancestry), she meets NCCN criteria for genetic testing. Other relatives are unavailable for genetic testing at this time.  Her mother's genetic testing was prior to 2010  and likely included BRCA1/2 only (no longer considered comprehensive for breast cancer risk as there are other genes associated with breast cancer risk).   We discussed the Genetic Information Non-Discrimination Act (GINA) of 2008, which helps protect individuals against genetic discrimination based on their genetic test results.  It impacts both health insurance and employment.  With health insurance, it protects against genetic test results being used for increased premiums or policy termination. For employment, it protects against hiring, firing and promoting decisions based on genetic test results.  GINA does not apply to those in the [employer], those who work for companies with less than 15 employees, and new life insurance or long-term disability insurance policies.  Health status due to a cancer diagnosis is not protected under GINA.  PLAN: After considering the risks, benefits, and limitations, Ms. Dungee provided informed consent to pursue genetic testing.  A lab appointment was scheduled for 3pm on February 28th at [facility], and the blood sample will be sent to [facility] for analysis of the CancerNext-Expanded +RNAinsight Panel. Results should be available within approximately 3 weeks after sample collection, at which point they will be disclosed by telephone to Ms. Saperstein, as will any additional recommendations warranted by these results. Ms. Marinez will receive a summary  of her genetic counseling visit and a copy of her results once available. This information will also be available in Epic.   Ms. Daus questions were answered to her satisfaction today. Our contact information was provided should additional questions or concerns arise.   Eleanora Guinyard M. Rennie Plowman, MS, [facility] Genetic Counselor [email or web address] [phone]    35 minutes were spent on the date of the encounter in service to the patient including preparation, telephone consultation, documentation and  care coordination.  The patient was accompanied by her husband.  Drs. Gunnar Bulla and/or Mosetta Putt were available to discuss this case as needed.   _______________________________________________________________________ For Office Staff:  Number of people involved in session: 2 Was an Intern/ student involved with case: no

## 2023-04-22 NOTE — Addendum Note (Signed)
 Addended by: Christiane Ha on: 04/22/2023 04:49 PM   Modules accepted: Orders

## 2023-04-25 ENCOUNTER — Inpatient Hospital Stay: Payer: Medicare Other

## 2023-04-25 DIAGNOSIS — Z800 Family history of malignant neoplasm of digestive organs: Secondary | ICD-10-CM

## 2023-04-25 DIAGNOSIS — Z803 Family history of malignant neoplasm of breast: Secondary | ICD-10-CM

## 2023-04-26 LAB — GENETIC SCREENING ORDER

## 2023-04-28 ENCOUNTER — Telehealth: Payer: Self-pay | Admitting: Genetic Counselor

## 2023-04-28 ENCOUNTER — Ambulatory Visit
Admission: RE | Admit: 2023-04-28 | Discharge: 2023-04-28 | Disposition: A | Discharge status: Home / Self Care | Payer: BC Managed Care – PPO | Source: Ambulatory Visit | Attending: Internal Medicine

## 2023-04-28 ENCOUNTER — Other Ambulatory Visit: Payer: Self-pay | Admitting: Genetic Counselor

## 2023-04-28 DIAGNOSIS — Z803 Family history of malignant neoplasm of breast: Secondary | ICD-10-CM

## 2023-04-28 DIAGNOSIS — Z800 Family history of malignant neoplasm of digestive organs: Secondary | ICD-10-CM

## 2023-04-28 DIAGNOSIS — Z1231 Encounter for screening mammogram for malignant neoplasm of breast: Secondary | ICD-10-CM

## 2023-04-28 NOTE — Telephone Encounter (Signed)
 Called patient to reschedule lab draw (incorrect lab kit was drawn last week).  Rescheduled lab for 3/4 at 3pm for [facility] Kit

## 2023-04-29 ENCOUNTER — Inpatient Hospital Stay: Attending: Genetic Counselor

## 2023-04-29 DIAGNOSIS — Z803 Family history of malignant neoplasm of breast: Secondary | ICD-10-CM

## 2023-04-29 DIAGNOSIS — Z800 Family history of malignant neoplasm of digestive organs: Secondary | ICD-10-CM

## 2023-05-02 ENCOUNTER — Encounter (HOSPITAL_BASED_OUTPATIENT_CLINIC_OR_DEPARTMENT_OTHER): Payer: Self-pay

## 2023-05-05 ENCOUNTER — Ambulatory Visit: Payer: Medicare Other | Attending: Cardiovascular Disease | Admitting: Cardiovascular Disease

## 2023-05-05 ENCOUNTER — Encounter: Payer: Self-pay | Admitting: Cardiovascular Disease

## 2023-05-05 DIAGNOSIS — J219 Acute bronchiolitis, unspecified: Secondary | ICD-10-CM | POA: Diagnosis present

## 2023-05-05 DIAGNOSIS — I5189 Other ill-defined heart diseases: Secondary | ICD-10-CM | POA: Insufficient documentation

## 2023-05-05 DIAGNOSIS — M5412 Radiculopathy, cervical region: Secondary | ICD-10-CM | POA: Insufficient documentation

## 2023-05-05 DIAGNOSIS — E7800 Pure hypercholesterolemia, unspecified: Secondary | ICD-10-CM | POA: Diagnosis present

## 2023-05-05 DIAGNOSIS — J4520 Mild intermittent asthma, uncomplicated: Secondary | ICD-10-CM | POA: Insufficient documentation

## 2023-05-05 DIAGNOSIS — R0602 Shortness of breath: Secondary | ICD-10-CM | POA: Diagnosis not present

## 2023-05-05 NOTE — Progress Notes (Unsigned)
 Patient ID: [name], female   DOB: 1960-05-17, 63 y.o.   MRN: [id number]      Primary MD: Dr. Maurice Small (retired);   HPI:  [name] is a 63 y.o. female  who is the the wife of Dr. Shirlean Alvey Brockel.  She established cardiology care with me in 2015 and I last saw her on December 06, 2021.  She presents for follow-up evaluation.  [name] denies any prior cardiac history.  She has remained fairly active and walks regularly with her husband.  In 2015 she noticed a definite change with development of more shortness of breath with activity and experienced a vague sensation of chest tightness which can occur at night.  She does not believe this is due to asthma.  She also has noticed some occasional palpitations.  She saw Dr. Valentina Lucks on 08/04/2013 and saw me for initial cardiology evaluation shortly thereafter.  A 2-D echo Doppler study on 09/01/2013 showed an ejection fraction of 55-60%.  She had normal wall motion without regional wall motion abnormalities.  Doppler parameters are consistent with mild grade 1 diastolic dysfunction.  There was trivial aortic insufficiency.  She underwent a nuclear perfusion study and exercise for 10 minutes and 21 seconds.  She stopped secondary to fatigue.  There were no diagnostic ST changes.  It was reported that she had a mild hypertensive response to exercise.  There was a mild defect  in the anteroapical region and it was felt most likely that this was breast attenuation artifact.  However, a minimal region of ischemia could not be completely excluded.  She had normal contractility with an ejection fraction of 66%.  When I saw her I recommended initiation of statin therapy in light of her lipid panel done by Dr. Doristine Locks in June 2015 which showed a total cholesterol 255, triglycerides 52, HDL 79, but an LDL of 166.  She has been taking Crestor 10 mg.  She did have subsequent blood work, which showed normal LFTs, and chemistry.  Glucose was  minimally increased at 107.  Hemoglobin 11.9, hematocrit 36.1.   She has been tolerating Crestor for hyperlipidemia.  2 months after initiation of therapy, an NMR profile showed significant benefit with her total cholesterol being reduced from 255 to169, her LDL from 166 to 75, and her LDL particle number was excellent at 857.  Insulin resistance score was excellent.  Her HDL was 83 and triglycerides were 53.  Initially she did feel mild myalgias but these have resolved.  He denies any recent chest pain.   I saw her in 2016 which time she continued to do well.  She was taking classes and was doing well without shortness of breath.  However at other times she noted mild shortness of breath particularly with uphill walking.  Lab work in 2016 revealed her to be  minimally anemic with a hemoglobin of 11.6 and hematocrit 35.5.  Lipid studies remained excellent on Crestor with a total cholesterol 161, triglycerides 42, HDL 85, and LDL 68.  Her fasting glucose had improved and was now normal at 96.  She underwent extensive back surgery.  Previously she had undergone fusion of C4-5 and C5-6 approximately 16..  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  She tolerated neurosurgery well without card  of vascular compromise.    I saw her in October 2018.  She underwent a 3 week trip to [place] where she was ambulating well.  She denied any chest pain or shortness of breath.  Specifically, there was no significant exertional angina.  She had blood work from her primary physician in August 2018.  Total cholesterol was 142, triglycerides 51, HDL 67, LDL 65, and non-HDL 75.  Chemistry profile was normal except glucose was minimally increased at 109.  She  was evaluated by Azalee Course, PA on May 13, 2019 and continued to do well.  Laboratory in August 2020 showed total cholesterol 151, HDL 73, LDL 68, triglycerides 56.  Continue to note some very mild shortness of breath with only very strenuous activity such as climbing uphill and denied any associated chest pain.  I last saw her on December 06, 2021.  She  continued to be active and walks daily with her husband.  She walks fairly regularly without shortness of breath but again if there is uphill walking she may experience shortness of breath which improves with stopping and then she resumes walking.  She notes rare palpitations.  She continues to be on rosuvastatin 10 mg for hyperlipidemia and takes omega-3 fatty acid predominantly for dry eyes and eyedrops followed by Dr. Burgess Estelle.  She has continued to be on 81 mg aspirin.  On November 20, 2021 she established with a new [facility] physician since Dr. Valentina Lucks has retired.  Laboratory from November 20, 2021 shows total cholesterol 159, HDL 76, LDL 75, triglycerides 40.  Creatinine 0.91.  Hemoglobin 11.5.  Potassium 4.5.  Transaminases normal.  During that evaluation, since 8 years had a lap since her echo Doppler study I discussed possible reevaluation and also recommended a baseline calcium score for assessment of coronary calcification particularly with her significant LDL elevation prior to 2015.  An echo Doppler study was done on January 13, 2022 and showed hyperdynamic LV function with EF 65 to 70%.  There was grade 1 diastolic dysfunction.  She had normal LV strain.  RV systolic pressure was normal at 23.2.  There was mild mitral regurgitation.  A coronary calcium score was done on January 22, 2022 was excellent with a calcium score of 0.  Her chest CT over read showed subtle.  Bronchovascular nodularity within the left lower lobe and lingula.  It was felt most likely reflective of an infectious or inflammatory bronchiolitis.  If patient was high risk, a noncontrast chest CT can  be considered in 12 months.  Presently, [name] continues to feel well.  She continues to exercise regularly.  She denies chest pain.  She still experiences some mild shortness of breath with fast uphill walking which improves with rest.  She continues to be on rosuvastatin 10 mg and omega-3 fatty acid over-the-counter for hyperlipidemia.  She is on aspirin 81 mg.  She takes Zegerid and H2 blocker therapy for GERD.  She has Ventolin as needed for.  She is here with her husband.    Medical history is also notable for significant esophageal reflux for which she takes concomitant therapy with Zantac, Zegerid, and Prevacid.  She also has intermittent, irritable bowel symptoms, and a history of asthma.  Prior Surgical history is notable for hysterectomy in 1995 and cervical discectomy in 2004.  In 2018 she underwent redo surgery with Dr. Danielle Dess with removal of previously placed hardware and decompression and fusion of C3-4, C6-7, and C7-T1.  Allergies  Allergen Reactions  Sulfa Antibiotics Other (See Comments)    Dyspnea and chest tightness   Other     [place] food (something in the spices) caused bronchospasms happens occasionally   Percocet [Oxycodone-Acetaminophen] Hives and Itching    Tolerates acetaminophen    Current Outpatient Medications  Medication Sig Dispense Refill   albuterol (VENTOLIN HFA) 108 (90 Base) MCG/ACT inhaler Ventolin HFA 90 mcg/actuation aerosol inhaler  INHALE 1 PUFF EVERY 4 HOURS     aspirin 81 MG tablet Take 81 mg by mouth daily. Takes daily except when bleeding     carboxymethylcellulose (REFRESH PLUS) 0.5 % SOLN Apply to eye.     Cholecalciferol (VITAMIN D) 2000 UNITS CAPS Take 2,000 Units by mouth daily.      Coenzyme Q10 300 MG CAPS Take 300 mg by mouth daily.     Estradiol (YUVAFEM) 10 MCG TABS vaginal tablet Place 10 mcg vaginally 3 (three) times a week.     Lansoprazole (PREVACID PO)      lansoprazole (PREVACID) 30 MG capsule Take 1 tablet by mouth daily.      LINZESS 290 MCG CAPS capsule Take 290 mcg by mouth daily.     magnesium oxide (MAG-OX) 400 MG tablet Take by mouth.     Omega-3 Fatty Acids (OMEGA-3 FISH OIL PO) Take 1 capsule by mouth daily.     Omeprazole-Sodium Bicarbonate (ZEGERID) 20-1100 MG CAPS capsule Take 1 capsule by mouth at bedtime.     Probiotic Product (ALIGN PO) Take 1 tablet by mouth daily.      ranitidine (ZANTAC) 150 MG tablet ranitidine 150 mg tablet     riboflavin (VITAMIN B-2) 100 MG TABS tablet Take 100 mg by mouth 2 (two) times daily.     rosuvastatin (CRESTOR) 10 MG tablet rosuvastatin 10 mg tablet     No current facility-administered medications for this visit.   Socially, she is married to Dr. Shirlean Aneya Daddona for 37 years.  She has 2 children who are both lawyers.  She previously was as a Airline pilot.  There is no tobacco history.  She rarely drinks alcohol.  She does exercise and walks regularly with her husband.  Family History  Problem Relation Age of Onset   Breast cancer Mother        dx 55; dx 89; DCIS; two primaries; ER+   Hypertension Father    Lung cancer Maternal Uncle 11       smoking hx   Diabetes Maternal Grandmother    Brain cancer Maternal Grandmother 40       mets from other site? ?melanoma   Diabetes Paternal Grandfather    Pancreatic cancer Paternal Grandfather 79   Breast cancer Cousin 69       mat female cousin   Brain cancer Other        glioblastoma in distant paternal cousin   Family history is also notable in that her father died with Parkinson's disease and had a possible cardiac arrest.  ROS General: Negative; No fevers, chills, or night sweats HEENT: Negative; No changes in vision or hearing, sinus congestion, difficulty swallowing Pulmonary: Positive for asthma; No cough, wheezing,hemoptysis Cardiovascular:  See HPI; no recent exertional dyspnea or chest pain.  No palpitations. 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 Skin: Negative; No rashes or skin lesions Psychiatric: Negative; No behavioral problems, depression Sleep: Negative; No daytime sleepiness, hypersomnolence, bruxism, restless legs, hypnogagnic hallucinations Other comprehensive 14 point system review is negative   Physical Exam BP 104/76   Pulse 66   Ht 5' 1.5" (1.562 m)   Wt 109 lb 3.2 oz (49.5 kg)   SpO2 99%   BMI 20.30 kg/m    Repeate blood pressure by me was 106/66  Wt Readings from Last 3 Encounters:  05/05/23 109 lb 3.2 oz (49.5 kg)  04/10/22 107 lb 6.4 oz (48.7 kg)  12/06/21 105 lb 3.2 oz (47.7 kg)   General: Alert, oriented, no distress.  Skin: normal turgor, no rashes, warm and dry HEENT: Normocephalic, atraumatic. Pupils equal round and reactive to light; sclera anicteric; extraocular muscles intact;  Nose without nasal septal hypertrophy Mouth/Parynx benign; Mallinpatti scale 2 Neck: No JVD, no carotid bruits; normal carotid upstroke Lungs: clear to ausculatation and percussion; no wheezing or rales Chest wall: without tenderness to palpitation Heart: PMI not displaced, RRR, s1 s2 normal, trivial 1/6 apical systolic murmur, no diastolic murmur, no rubs, gallops, thrills, or heaves Abdomen: soft, nontender; no hepatosplenomehaly, BS+; abdominal aorta nontender and not dilated by palpation. Back: no CVA tenderness Pulses 2+ Musculoskeletal: full range of motion, normal strength, no joint deformities Extremities: no clubbing cyanosis or edema, Homan's sign negative  Neurologic: grossly nonfocal; Cranial nerves grossly wnl Psychologic: Normal mood and affect     April 10, 2022 ECG (independently read by me): NSR at 79, no ST changes, no ectopy  December 06, 2021 ECG (independently read by me): Normal sinus rhythm at 70 bpm.  Nonspecific ST abnormality.  Normal intervals.  No ectopy  December 17, 2016 ECG (independently read by me): Normal sinus rhythm at 78 bpm.  No ectopy.  Normal intervals.  No ST segment changes.  October 2017 ECG (independently read by me): Normal sinus rhythm at 62 bpm.  Normal intervals.  No ST segment changes.  September 2016 ECG (independently read by me): Normal sinus rhythm at 61 bpm.  Normal intervals.  No ST segment changes.  February 2016 ECG (independently read by me) : Normal sinus rhythm at 69 bpm.  No significant ST segment changes.  Normal intervals.    August 16, 2013 ECG (independently read by me): Normal sinus rhythm at 65 beats per minute.  PR interval normal at 124 Marcia.  QT 0.49 Marcia.  LABS:    Latest Ref Rng & Units 06/17/2016   12:36 PM 10/20/2014    9:26 AM 10/04/2013    9:40 AM  BMP  Glucose 65 - 99 mg/dL 409  96  811   BUN 6 - 20 mg/dL 19  17  21    Creatinine 0.44 - 1.00 mg/dL 9.14  7.82  9.56   Sodium 135 - 145 mmol/L 138  140  143   Potassium 3.5 - 5.1 mmol/L 4.0  4.5  5.1   Chloride 101 - 111 mmol/L 104  104  103   CO2 22 - 32 mmol/L 26  30  28    Calcium 8.9 - 10.3 mg/dL 9.5  9.3  9.6       Latest Ref Rng & Units 10/20/2014    9:26 AM 10/04/2013    9:40 AM 03/20/2010    6:58 PM  Hepatic Function  Total Protein 6.1 - 8.1 g/dL 6.8  7.3  7.7   Albumin 3.6 -  5.1 g/dL 4.0  3.8  4.0   AST 10 - 35 U/L 16  15  25    ALT 6 - 29 U/L 11  18  20    Alk Phosphatase 33 - 130 U/L 42  39  58   Total Bilirubin 0.2 - 1.2 mg/dL 0.5  0.4  0.6       Latest Ref Rng & Units 06/17/2016   12:36 PM 10/20/2014    9:06 AM 10/04/2013    9:40 AM  CBC  WBC 4.0 - 10.5 K/uL 7.4  5.5  6.6   Hemoglobin 12.0 - 15.0 g/dL 13.2  44.0  10.2   Hematocrit 36.0 - 46.0 % 37.4  35.5  36.1   Platelets 150 - 400 K/uL 270  256  254     Lab Results  Component Value Date   MCV 88.2 06/17/2016   MCV 89.4 10/20/2014   MCV 91.4 10/04/2013   Lab Results  Component Value Date   TSH 2.466 10/20/2014  No results found for: "HGBA1C"  Lipid Panel     Component Value Date/Time   CHOL 161 10/20/2014 0933   CHOL 169 10/08/2013 0911   TRIG 42 10/20/2014 0933   TRIG 53 10/08/2013 0911   HDL 85 10/20/2014 0933   HDL 83 10/08/2013 0911   CHOLHDL 1.9 10/20/2014 0933   VLDL 8 10/20/2014 0933   LDLCALC 68 10/20/2014 0933   LDLCALC 75 10/08/2013 0911    NMR Lipid Panel  10/08/2013:   Ref Range 64mo ago     LDL Particle Number <1000 nmol/L 857    Comments: Reference Range   Low < 1000   Moderate 1000 - 1299   Borderline-High 1300 - 1599   High 1600 - 2000   Very High > 2000     LDL (calc) <100 mg/dL 75    Comments: LDL-C is inaccurate if patient is nonfasting. Reference Range   Optimal < 100   Near/Above Optimal 100 - 129   Borderline High 130 - 159   High 160 - 189   Very High >= 190     HDL-C >=40 mg/dL 83    Triglycerides <725 mg/dL 53    Cholesterol, Total <200 mg/dL 366    HDL Particle Number >=30.5 umol/L 34.3    Large HDL-P >=4.8 umol/L 18.5    Large VLDL-P <=2.7 nmol/L < 0.8    Small LDL Particle Number <=527 nmol/L 96    LDL Size >20.5 nm 21.3    HDL Size >=9.2 nm 10.5    VLDL Size nm See Note    Comments: VLDL concentration too low to allow determination of VLDL Size. Low VLDL concentration contributes minimally to the LP-IR score.    LP-IR Score <= 45  < 25        Laboratory from [facility] physicians on 09/27/2016 was reviewed.  BUN 23, creatinine 0.82.  Normal LFTs.  Normal electrolytes.  Lipid studies as noted above in history of present illness.  Laboratory from [facility] on November 20, 2021 as noted above in HPI  RADIOLOGY: No results found.  ECHO: 01/03/2022  1. Left ventricular ejection fraction, by estimation, is 65 to 70%. Left  ventricular ejection fraction by 3D volume is 67 %. The  left ventricle has  normal function. The left ventricle has no regional wall motion  abnormalities. Left ventricular diastolic   parameters are consistent with Grade I diastolic dysfunction (impaired  relaxation). The average left ventricular global longitudinal strain is  -  25.3 %. The global longitudinal strain is normal.   2. Right ventricular systolic function is normal. The right ventricular  size is normal. There is normal pulmonary artery systolic pressure. The  estimated right ventricular systolic pressure is 23.2 mmHg.   3. The mitral valve is normal in structure. Mild mitral valve  regurgitation. No evidence of mitral stenosis.   4. The aortic valve is tricuspid. Aortic valve regurgitation is not  visualized. No aortic stenosis is present.   5. The inferior vena cava is normal in size with greater than 50%  respiratory variability, suggesting right atrial pressure of 3 mmHg.    CT CARDIAC SCORE: 01/03/2022 FINDINGS: Coronary arteries: Normal origins.   Coronary Calcium Score:   Left main:   Left anterior descending artery:   Left circumflex artery:   Right coronary artery:   Total: 0   Percentile:   Pericardium: Normal.   Ascending Aorta: Normal caliber.   Non-cardiac: See separate report from [facility].   IMPRESSION: Coronary calcium score of 0.   OVER-READ INTERPRETATION CT CHEST  IMPRESSION: Subtle peribronchovascular nodularity within the left lower lobe and lingula, likely reflective of an infectious or inflammatory bronchiolitis. No follow-up needed if patient is low-risk (and has no known or suspected primary neoplasm). Non-contrast chest CT can be considered in 12 months if patient is high-risk. This recommendation follows the consensus statement: Guidelines for Management of Incidental Pulmonary Nodules Detected on CT Images: From the Fleischner Society 2017; Radiology 2017; 284:228-243.  IMPRESSION:  1. Pure hypercholesterolemia    2. Grade I diastolic dysfunction    ASSESSMENT AND PLAN: Marcia  [name] is a 63 year-old female who has a history of significant GERD and takes Zegerid as well as lansoprazole and ranitidine.  Remotey, she developed significant, morning hoarseness as result of gastric reflux.  She has a history of mild asthma, for which he takes Proventil on an as-needed basis.  She has remained active but remotely had noticed shortness of breath particularly with walking steep hills.  These episodes would resolve promptly.  She also admits to a history of short-lived palpitations which would last seconds.  In 2015 an 2D echo Doppler study  showeded normal LV systolic function with EF 55 to 60%.  There was grade 1 diastolic dysfunction and trivial aortic insufficiency.  She underwent a nuclear perfusion study which was low risk and revealed probable breast attenuation artifact although a minimum region of ischemia could not be completely excluded.  She had normal contractility with EF at 66%.  ECG was normal.  She had significant hyperlipidemia with LDL cholesterols elevated at 166.  Since initiating statin therapy in 2015, lipid studies have been excellent with LDL cholesterol was typically running around 75.  I reviewed laboratory done in [place] on November 20, 2021.  Total cholesterol was 159, HDL 76, LDL 75, and triglycerides 40.  Only, she continues to be active and walks regularly with her husband.  She still notes some shortness of breath if she goes up a steep hill which resolves with rest and then she can resume walking.  She notes rare palpitations.  She has been on omega-3 fatty acid for dry eyes.  She is on rosuvastatin 10 mg for her lipids.  When I saw her in October 2023 since 8 years have elapsed since her echo Doppler study I discussed reevaluation with echocardiography and also recommended baseline CT cardiac scoring.  Her echo Doppler study remains unchanged and shows hyperdynamic LV systolic function with  EF at 65 to 70%.  There was grade 1 diastolic dysfunction and mild MR.  Her coronary calcium score is excellent at 0.  Incidentally, she was found to have subtle peribronchial vascular nodule in July ready within this superomedial aspect of the left lower lobe and within the lingula with the largest nodule measuring 4 mm in the left lower lobe.  She is low risk.  If high risk, noncontrast chest CT was recommended in 1 year.  I discussed this with both Dr. And Mrs. Knodel main.  They would prefer a 1 year follow-up evaluation.  As result I will schedule this to be done in February 2025 after her [birth year] birthday since she will then be on Medicare and I will see her back in follow-up in 1 year for further evaluation.  Her blood pressure continues to be excellent.  She is tolerating rosuvastatin 10 mg and omega-3 fatty acid with LDL cholesterol in September 2023 at 75.  I will see her in 1 year for reevaluation.     Lennette Bihari, MD, [facility] 05/05/2023 2:30 PM

## 2023-05-05 NOTE — Patient Instructions (Signed)
 Medication Instructions:  No medication changes were made during today's visit.  *If you need a refill on your cardiac medications before your next appointment, please call your pharmacy*   Lab Work: No labs were ordered during today's visit.  If you have labs (blood work) drawn today and your tests are completely normal, you will receive your results only by: MyChart Message (if you have MyChart) OR A paper copy in the mail If you have any lab test that is abnormal or we need to change your treatment, we will call you to review the results.   Testing/Procedures: No procedures were ordered during today's visit.    Follow-Up: At [facility], you and your health needs are our priority.  As part of our continuing mission to provide you with exceptional heart care, we have created designated Provider Care Teams.  These Care Teams include your primary Cardiologist (physician) and Advanced Practice Providers (APPs -  Physician Assistants and Nurse Practitioners) who all work together to provide you with the care you need, when you need it.  We recommend signing up for the patient portal called "MyChart".  Sign up information is provided on this After Visit Summary.  MyChart is used to connect with patients for Virtual Visits (Telemedicine).  Patients are able to view lab/test results, encounter notes, upcoming appointments, etc.  Non-urgent messages can be sent to your provider as well.   To learn more about what you can do with MyChart, go to [email or web address].    Your next appointment:   1 year(s)  Provider:   Dr. Royann Shivers     Other Instructions        1st Floor: - Lobby - Registration  - Pharmacy  - Lab - Cafe   2nd Floor: - PV Lab - Diagnostic Testing (echo, CT, nuclear med)   3rd Floor: - Vacant   4th Floor: - TCTS (cardiothoracic surgery) - AFib Clinic - Structural Heart Clinic - Vascular Surgery  - Vascular Ultrasound   5th Floor: - HeartCare  Cardiology (general and EP) - Clinical Pharmacy for coumadin, hypertension, lipid, weight-loss medications, and med management appointments      Valet parking services will be available as well.

## 2023-05-07 ENCOUNTER — Encounter: Payer: Self-pay | Admitting: Cardiovascular Disease

## 2023-05-07 DIAGNOSIS — R0602 Shortness of breath: Secondary | ICD-10-CM

## 2023-05-07 DIAGNOSIS — R911 Solitary pulmonary nodule: Secondary | ICD-10-CM

## 2023-05-07 DIAGNOSIS — J4520 Mild intermittent asthma, uncomplicated: Secondary | ICD-10-CM

## 2023-05-07 DIAGNOSIS — J219 Acute bronchiolitis, unspecified: Secondary | ICD-10-CM

## 2023-05-07 DIAGNOSIS — J45909 Unspecified asthma, uncomplicated: Secondary | ICD-10-CM

## 2023-05-15 ENCOUNTER — Encounter: Payer: Self-pay | Admitting: Genetic Counselor

## 2023-05-15 ENCOUNTER — Ambulatory Visit: Payer: Self-pay | Admitting: Genetic Counselor

## 2023-05-15 DIAGNOSIS — Z1379 Encounter for other screening for genetic and chromosomal anomalies: Secondary | ICD-10-CM

## 2023-05-15 DIAGNOSIS — Z803 Family history of malignant neoplasm of breast: Secondary | ICD-10-CM

## 2023-05-15 DIAGNOSIS — Z800 Family history of malignant neoplasm of digestive organs: Secondary | ICD-10-CM

## 2023-05-15 NOTE — Progress Notes (Signed)
 HPI:   [name] was previously seen in the [facility] due to a family history of breast, pancreatic, and other cancers and concerns regarding a hereditary predisposition to cancer.    [name] recent genetic test results were disclosed to [name] by telephone. These results and recommendations are discussed in more detail below.  CANCER HISTORY:  Oncology History   No history exists.    FAMILY HISTORY:  We obtained a detailed, 4-generation family history.  Significant diagnoses are listed below: Family History  Problem Relation Age of Onset   Breast cancer Mathews        dx 15; dx 74; DCIS; two primaries; ER+   Hypertension Father    Lung cancer Maternal Uncle 56       smoking hx   Brain cancer Maternal Grandmother 40       mets from other site? ?melanoma   Pancreatic cancer Paternal Grandfather 71   Breast cancer Cousin 64       mat female cousin   Brain cancer Other        glioblastoma in distant paternal cousin         [name] stated that [name] had genetic testing for BRCA1/2 only more than 15 years ago.  No report was available for review today.  She is unaware of other family history of hereditary cancer genetic testing.  Other relatives are unavailable for genetic testing at this time.                There is reported maternal and paternal Ashkenazi Jewish ancestry.   GENETIC TEST RESULTS:  The [facility] CancerNext-Expanded +RNAinsight Panel found no pathogenic mutations.   The CancerNext-Expanded gene panel offered by [facility] and includes sequencing, rearrangement, and RNA analysis for the following 76 genes: AIP, ALK, APC, ATM, AXIN2, BAP1, BARD1, BMPR1A, BRCA1, BRCA2, BRIP1, CDC73, CDH1, CDK4, CDKN1B, CDKN2A, CEBPA, CHEK2, CTNNA1, DDX41, DICER1, ETV6, FH, FLCN, GATA2, LZTR1, MAX, MBD4, MEN1, MET, MLH1, MSH2, MSH3, MSH6, MUTYH, NF1, NF2, NTHL1, PALB2, PHOX2B, PMS2, POT1, PRKAR1A, PTCH1, PTEN, RAD51C, RAD51D, RB1, RET, RUNX1,  SDHA, SDHAF2, SDHB, SDHC, SDHD, SMAD4, SMARCA4, SMARCB1, SMARCE1, STK11, SUFU, TMEM127, TP53, TSC1, TSC2, VHL, and WT1 (sequencing and deletion/duplication); EGFR, HOXB13, KIT, MITF, PDGFRA, POLD1, and POLE (sequencing only); EPCAM and GREM1 (deletion/duplication only).   The test report has been scanned into EPIC and is located under the Molecular Pathology section of the Results Review tab.  A portion of the Mathews report is included below for reference. Genetic testing reported out on May 13, 2023.      Even though a pathogenic variant was not identified, possible explanations for the cancer in the family may include: There may be no hereditary risk for cancer in the family. The cancers in [name] family may be sporadic/familial Mathews due to other genetic and environmental factors.  Most cancer is not hereditary.  There may be a gene mutation in one of these genes that current testing methods cannot detect but that chance is small. There could be another gene that has not yet been discovered, Mathews that we have not yet tested, that is responsible for the cancer diagnoses in the family.  It is also possible there is a hereditary cause for the cancer in the family that [name].   Therefore, it is important to remain in touch with cancer genetics in the future so that we can continue to offer [name] the most up  to date genetic testing.    ADDITIONAL GENETIC TESTING:   [name] genetic testing was fairly extensive.  If there are additional relevant genes identified to increase cancer risk that can be analyzed in the future, we would be happy to discuss and coordinate this testing at that time.     CANCER SCREENING RECOMMENDATIONS:  [name] is considered negative (normal).  This means that we have not identified a hereditary cause for [name] family history of cancer at this time.   An individual's cancer risk and medical management are not  determined by genetic test results alone. Overall cancer risk assessment incorporates additional factors, including personal medical history, family history, and any available genetic information that may Mathews in a personalized plan for cancer prevention and surveillance. Therefore, it is recommended she continue to follow the cancer management and screening guidelines provided by [name] primary healthcare provider.  [name] lifetime risk for breast cancer based on Tyrer-Cuzick risk model and reported personal/family history is 10.5%.  This is elevated compared to the general population but not in the 'high risk for breast cancer' category per NCCN guidelines (>20%).  This risk estimate can change over time and may be repeated to reflect new information in [name] personal Mathews family history in the future.  We encouraged [name] to remain diligent about annual breast imaging.  [name] 5 year risk score based on Tyrer Cuzik and Dondra Spry is >3%.  She was offered a referral to the high risk breast clinic to discuss the benefits and limitations of breast cancer risk reducing agents.  She feels comfortable with [name] risk numbers and did not wish to proceed with a referral at this time.        RECOMMENDATIONS FOR FAMILY MEMBERS:   Since she did not Mathews a identifiable mutation in a cancer predisposition gene included on this panel, [name] could not have inherited a known mutation from [name] in one of these genes. Individuals in this family might be at some increased risk of developing cancer, over the general population risk, due to the family history of cancer.  Individuals in the family should notify their providers of the family history of cancer. We recommend women in this family have a yearly mammogram beginning at age 21, Mathews 33 years younger than the earliest onset of cancer, an annual clinical breast exam, and perform monthly breast self-exams.  Risk models that take into account family history and hormonal history may  be helpful in determining appropriate breast cancer screening options for family members.  Other members of the family may still carry a pathogenic variant in one of these genes that [name] did not Mathews. Based on the family history, we recommend [name] have updated genetic counseling/testing.  She previously stated that it would be difficult for [name] to have updated genetic testing.  We discussed that relatives closely related to [name], such as [name] brothers, are candidates for genetics.   FOLLOW-UP:  Cancer genetics is a rapidly advancing field and it is possible that new genetic tests will be appropriate for [name] and/Mathews [name] family members in the future. We encourage [name] to remain in contact with cancer genetics, so we can update [name] personal and family histories and let [name] know of advances in cancer genetics that may benefit this family.   Our contact number was provided.  They are welcome to call us at anytime with additional questions Mathews concerns.   Marcia Orantes M. Rennie Plowman, MS, LCGC  Education officer, [email or web address] (P) [phone]

## 2023-05-19 NOTE — Addendum Note (Signed)
 Addended by: Lindell Spar on: 05/19/2023 11:03 AM   Modules accepted: Orders

## 2023-05-26 ENCOUNTER — Telehealth: Payer: Self-pay | Admitting: Cardiovascular Disease

## 2023-05-26 NOTE — Telephone Encounter (Signed)
 Pt would like to know if she is able to come by the office to pickup referral for Pulmonary. Pt states reason being, referred to office is saying that they have yet to receive the referral from our office and pt wants to go ahead and get scheduled. Please advise

## 2023-05-26 NOTE — Telephone Encounter (Signed)
 Spoke with pt, aware I spoke with the pulmonary office and they now have her referral.

## 2023-06-18 ENCOUNTER — Ambulatory Visit
Admission: RE | Admit: 2023-06-18 | Discharge: 2023-06-18 | Disposition: A | Discharge status: Home / Self Care | Payer: BC Managed Care – PPO | Source: Ambulatory Visit | Attending: Internal Medicine

## 2023-06-18 DIAGNOSIS — M8580 Other specified disorders of bone density and structure, unspecified site: Secondary | ICD-10-CM

## 2023-09-12 ENCOUNTER — Ambulatory Visit: Admitting: Plastic Surgery

## 2023-09-12 ENCOUNTER — Encounter: Payer: Self-pay | Admitting: Plastic Surgery

## 2023-09-12 VITALS — BP 99/65 | HR 85 | Ht 61.0 in | Wt 101.4 lb

## 2023-09-12 DIAGNOSIS — D164 Benign neoplasm of bones of skull and face: Secondary | ICD-10-CM

## 2023-09-12 NOTE — Progress Notes (Signed)
 Patient ID: [name], female    DOB: September 14, 1960, 63 y.o.   MRN: [id number]   Chief Complaint  Patient presents with   Consult         The patient is a 63 year old female here with her husband for evaluation of a mass on her forehead.  It is about a centimeter in size.  It does feel bony in nature and not movable.  The patient does recall trauma when she was a child that healed without incident.  The lesions been getting larger over the last several years.  It is noticeable.  It is not painful.  There are no other concerning lesions.  She is otherwise in good health.  She is 5 feet 1 inch tall and weighs 101 pounds.    Review of Systems  Constitutional: Negative.   HENT: Negative.    Eyes: Negative.   Respiratory: Negative.    Cardiovascular: Negative.   Gastrointestinal: Negative.   Endocrine: Negative.   Genitourinary: Negative.   Musculoskeletal: Negative.     Past Medical History:  Diagnosis Date   Arthritis    Asthma    asthmatic bronchitis   Dyspnea    on exertion   GERD (gastroesophageal reflux disease)    Heart murmur     Past Surgical History:  Procedure Laterality Date   ABDOMINAL HYSTERECTOMY     ANTERIOR CERVICAL DECOMP/DISCECTOMY FUSION N/A 06/25/2016   Procedure: CERVICAL THREE- CERVICAL FOUR, CERVICAL SIX- CERVICAL SEVEN, CERVICAL SEVEN- THORACIC ONE  ANTERIOR CERVICAL DECOMPRESSION/DISCECTOMY FUSION, REMOVAL OF ANTERIOR CERVICAL PLATE CERVICAL FOUR- CERVICAL SIX;  Surgeon: Victory Gens, MD;  Location: [facility] OR;  Service: Neurosurgery;  Laterality: N/A;   CERVICAL SPINE SURGERY     4-5 :5-6 cervical fusion sixteen yrs ago      Current Outpatient Medications:    albuterol  (VENTOLIN  HFA) 108 (90 Base) MCG/ACT inhaler, Ventolin  HFA 90 mcg/actuation aerosol inhaler  INHALE 1 PUFF EVERY 4 HOURS, Disp: , Rfl:    aspirin 81 MG tablet, Take 81 mg by mouth daily. Takes daily except when bleeding, Disp: , Rfl:    carboxymethylcellulose (REFRESH PLUS)  0.5 % SOLN, Apply to eye., Disp: , Rfl:    Cholecalciferol (VITAMIN D) 2000 UNITS CAPS, Take 2,000 Units by mouth daily. , Disp: , Rfl:    Coenzyme Q10 300 MG CAPS, Take 300 mg by mouth daily., Disp: , Rfl:    Estradiol (YUVAFEM) 10 MCG TABS vaginal tablet, Place 10 mcg vaginally 3 (three) times a week., Disp: , Rfl:    Lansoprazole (PREVACID PO), , Disp: , Rfl:    lansoprazole (PREVACID) 30 MG capsule, Take 1 tablet by mouth daily., Disp: , Rfl:    LINZESS  290 MCG CAPS capsule, Take 290 mcg by mouth daily., Disp: , Rfl:    magnesium oxide (MAG-OX) 400 MG tablet, Take by mouth., Disp: , Rfl:    Omega-3 Fatty Acids (OMEGA-3 FISH OIL PO), Take 1 capsule by mouth daily., Disp: , Rfl:    Omeprazole-Sodium Bicarbonate (ZEGERID) 20-1100 MG CAPS capsule, Take 1 capsule by mouth at bedtime., Disp: , Rfl:    Probiotic Product (ALIGN PO), Take 1 tablet by mouth daily. , Disp: , Rfl:    ranitidine (ZANTAC) 150 MG tablet, ranitidine 150 mg tablet, Disp: , Rfl:    riboflavin (VITAMIN B-2) 100 MG TABS tablet, Take 100 mg by mouth 2 (two) times daily., Disp: , Rfl:    rosuvastatin  (CRESTOR ) 10 MG tablet, rosuvastatin  10 mg tablet, Disp: ,  Rfl:    Objective:   Vitals:   09/12/23 1039  BP: 99/65  Pulse: 85  SpO2: 97%    Physical Exam Vitals reviewed.  Constitutional:      Appearance: Normal appearance.  HENT:     Head: Atraumatic.  Cardiovascular:     Rate and Rhythm: Normal rate.     Pulses: Normal pulses.  Pulmonary:     Effort: Pulmonary effort is normal.  Skin:    General: Skin is warm.  Neurological:     Mental Status: She is alert and oriented to person, place, and time.  Psychiatric:        Mood and Affect: Mood normal.        Behavior: Behavior normal.        Thought Content: Thought content normal.        Judgment: Judgment normal.     Assessment & Plan:  Osteoma of skull  Plan for excision of osteoma of scalp in the OR.  Patient is aware she will have a scar.  Pictures  were obtained of the patient and placed in the chart with the patient's or guardian's permission.   Estefana RAMAN Ylianna Almanzar, DO

## 2023-09-25 ENCOUNTER — Telehealth: Payer: Self-pay

## 2023-09-25 ENCOUNTER — Ambulatory Visit (INDEPENDENT_AMBULATORY_CARE_PROVIDER_SITE_OTHER): Admitting: Physician Assistant

## 2023-09-25 VITALS — BP 103/65 | HR 84 | Wt 100.0 lb

## 2023-09-25 DIAGNOSIS — D164 Benign neoplasm of bones of skull and face: Secondary | ICD-10-CM

## 2023-09-25 MED ORDER — CEPHALEXIN 500 MG PO CAPS: 500.0000 mg | ORAL_CAPSULE | Freq: Four times a day (QID) | ORAL | 0 refills | Status: AC

## 2023-09-25 MED ORDER — HYDROCODONE-ACETAMINOPHEN 5-325 MG PO TABS: 1.0000 | ORAL_TABLET | Freq: Four times a day (QID) | ORAL | 0 refills | Status: AC | PRN

## 2023-09-25 MED ORDER — ONDANSETRON 4 MG PO TBDP: 4.0000 mg | ORAL_TABLET | Freq: Three times a day (TID) | ORAL | 0 refills | Status: AC | PRN

## 2023-09-25 NOTE — H&P (View-Only) (Signed)
 Patient ID: [name], female    DOB: 15-Oct-1960, 63 y.o.   MRN: [id number]  Chief Complaint  Patient presents with   Pre-op Exam      ICD-10-CM   1. 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  female  with a history of forehead osteoma.  She presents for preoperative evaluation for upcoming procedure, excision of forehead osteoma, scheduled for 10/09/2023 with Dr. Lowery.  The patient has not had problems with anesthesia.  She is accompanied by her husband at bedside.  She has recently been seen by pulmonology for bronchiolitis, deemed not infective.  She will occasionally take albuterol  prophylactically prior to exercise with good effect.  She denies any personal or family history of blood clots or clotting disorder.  Denies any personal history of cancer, severe cardiac disease, MI/CVA, nicotine use, oral OCPs, or varicosities.  She takes the 81 mg ASA daily, but understands the need to hold 7 days prior to surgery.  May restart 2 days postoperatively.  Will send clearance to patient's primary care provider.  Discussed expectations regarding the osteoma excision and they are understanding and agreeable to proceed.  May consider Arnica to help with bruising postoperatively.  Emphasized the importance of compression, as well.  Of note, they would prefer local anesthesia with sedation rather than general.  Evidently this was fine per Dr. Lowery, but will still defer to anesthesia team.  Summary of Previous Visit: She was seen for consult Dr. Lowery on 09/12/2023.  At that time, reported 1 cm mass on the forehead that felt bony.  Deemed to likely osteoma.  Discussed surgical excision.  Patient understands that there will be a scar.  Agreeable to proceed.  Job: Retired Art therapist.  PMH Significant for: Forehead osteoma, GERD, asthma, bronchiolitis, pure hypercholesterolemia seen by [facility] heart care on statin and  ASA 81 mg.   Past Medical History: Allergies: Allergies  Allergen Reactions   Sulfa Antibiotics Other (See Comments)    Dyspnea and chest tightness   Other     [place] food (something in the spices) caused bronchospasms happens occasionally   Percocet [Oxycodone-Acetaminophen ] Hives and Itching    Tolerates acetaminophen     Current Medications:  Current Outpatient Medications:    albuterol  (VENTOLIN  HFA) 108 (90 Base) MCG/ACT inhaler, Ventolin  HFA 90 mcg/actuation aerosol inhaler  INHALE 1 PUFF EVERY 4 HOURS, Disp: , Rfl:    aspirin 81 MG tablet, Take 81 mg by mouth daily. Takes daily except when bleeding, Disp: , Rfl:    carboxymethylcellulose (REFRESH PLUS) 0.5 % SOLN, Apply to eye., Disp: , Rfl:    cephALEXin  (KEFLEX ) 500 MG capsule, Take 1 capsule (500 mg total) by mouth 4 (four) times daily for 3 days., Disp: 12 capsule, Rfl: 0   Cholecalciferol (VITAMIN D) 2000 UNITS CAPS, Take 2,000 Units by mouth daily. , Disp: , Rfl:    Coenzyme Q10 300 MG CAPS, Take 300 mg by mouth daily., Disp: , Rfl:    Estradiol (YUVAFEM) 10 MCG TABS vaginal tablet, Place 10 mcg vaginally 3 (three) times a week., Disp: , Rfl:    HYDROcodone -acetaminophen  (NORCO/VICODIN) 5-325 MG tablet, Take 1 tablet by mouth every 6 (six) hours as needed for up to 2 days for moderate pain (pain score 4-6)., Disp: 4 tablet, Rfl: 0   Lansoprazole (PREVACID PO), , Disp: , Rfl:    lansoprazole (PREVACID) 30 MG capsule,  Take 1 tablet by mouth daily., Disp: , Rfl:    LINZESS  290 MCG CAPS capsule, Take 290 mcg by mouth daily., Disp: , Rfl:    magnesium oxide (MAG-OX) 400 MG tablet, Take by mouth., Disp: , Rfl:    Omega-3 Fatty Acids (OMEGA-3 FISH OIL PO), Take 1 capsule by mouth daily., Disp: , Rfl:    Omeprazole-Sodium Bicarbonate (ZEGERID) 20-1100 MG CAPS capsule, Take 1 capsule by mouth at bedtime., Disp: , Rfl:    ondansetron  (ZOFRAN -ODT) 4 MG disintegrating tablet, Take 1 tablet (4 mg total) by mouth every 8 (eight) hours as  needed for nausea or vomiting., Disp: 20 tablet, Rfl: 0   Probiotic Product (ALIGN PO), Take 1 tablet by mouth daily. , Disp: , Rfl:    ranitidine (ZANTAC) 150 MG tablet, ranitidine 150 mg tablet, Disp: , Rfl:    riboflavin (VITAMIN B-2) 100 MG TABS tablet, Take 100 mg by mouth 2 (two) times daily., Disp: , Rfl:    rosuvastatin  (CRESTOR ) 10 MG tablet, rosuvastatin  10 mg tablet, Disp: , Rfl:   Past Medical Problems: Past Medical History:  Diagnosis Date   Arthritis    Asthma    asthmatic bronchitis   Dyspnea    on exertion   GERD (gastroesophageal reflux disease)    Heart murmur     Past Surgical History: Past Surgical History:  Procedure Laterality Date   ABDOMINAL HYSTERECTOMY     ANTERIOR CERVICAL DECOMP/DISCECTOMY FUSION N/A 06/25/2016   Procedure: CERVICAL THREE- CERVICAL FOUR, CERVICAL SIX- CERVICAL SEVEN, CERVICAL SEVEN- THORACIC ONE  ANTERIOR CERVICAL DECOMPRESSION/DISCECTOMY FUSION, REMOVAL OF ANTERIOR CERVICAL PLATE CERVICAL FOUR- CERVICAL SIX;  Surgeon: Victory Gens, MD;  Location: [facility] OR;  Service: Neurosurgery;  Laterality: N/A;   CERVICAL SPINE SURGERY     4-5 :5-6 cervical fusion sixteen yrs ago    Social History: Social History   Socioeconomic History   Marital status: Married    Spouse name: Not on file   Number of children: Not on file   Years of education: Not on file   Highest education level: Not on file  Occupational History   Not on file  Tobacco Use   Smoking status: Never   Smokeless tobacco: Never  Substance and Sexual Activity   Alcohol  use: Yes    Comment: Once a month or less   Drug use: Not on file   Sexual activity: Not on file  Other Topics Concern   Not on file  Social History Narrative   Not on file   Social Drivers of Health   Financial Resource Strain: Not on file  Food Insecurity: Not on file  Transportation Needs: Not on file  Physical Activity: Not on file  Stress: Not on file  Social Connections: Not on file  Intimate  Partner Violence: Not on file    Family History: Family History  Problem Relation Age of Onset   Breast cancer Mother        dx 26; dx 82; DCIS; two primaries; ER+   Hypertension Father    Lung cancer Maternal Uncle 54       smoking hx   Diabetes Maternal Grandmother    Brain cancer Maternal Grandmother 40       mets from other site? ?melanoma   Diabetes Paternal Grandfather    Pancreatic cancer Paternal Grandfather 86   Breast cancer Cousin 47       mat female cousin   Brain cancer Other        glioblastoma  in distant paternal cousin    Review of Systems: ROS Denies any recent chest pain, difficulty breathing, fevers, leg swelling.  Physical Exam: Vital Signs BP 103/65 (BP Location: Left Arm, Patient Position: Sitting, Cuff Size: Normal)   Pulse 84   Wt 100 lb (45.4 kg)   SpO2 96%   BMI 18.89 kg/m   Physical Exam Constitutional:      General: Not in acute distress.    Appearance: Normal appearance. Not ill-appearing.  HENT:     Head: Normocephalic and atraumatic.  Eyes:     Pupils: Pupils are equal, round. Cardiovascular:     Rate and Rhythm: Normal rate.    Pulses: Normal pulses.  Pulmonary:     Effort: No respiratory distress or increased work of breathing.  Speaks in full sentences. Abdominal:     General: Abdomen is flat. No distension.   Musculoskeletal: Normal range of motion. No lower extremity swelling or edema. No varicosities. Skin:    General: Skin is warm and dry.     Findings: No erythema or rash.  Neurological:     Mental Status: Alert and oriented to person, place, and time.  Psychiatric:        Mood and Affect: Mood normal.        Behavior: Behavior normal.    Assessment/Plan: The patient is scheduled for excision of forehead osteoma with Dr. Lowery.  Risks, benefits, and alternatives of procedure discussed, questions answered and consent obtained.    Smoking Status: Non-smoker.  Caprini Score: 4; Risk Factors include: Age and  length of planned surgery. Recommendation for mechanical prophylaxis. Encourage early ambulation.   Pictures obtained: 09/12/2023  Post-op Rx sent to pharmacy: Vicodin, Keflex , Zofran . Per patient and husband, hydrocodone  is OK, just not oxycodone.   Patient was provided with the General Surgical Risk consent document and Pain Medication Agreement prior to their appointment.  They had adequate time to read through the risk consent documents and Pain Medication Agreement. We also discussed them in person together during this preop appointment. All of their questions were answered to their satisfaction.  Recommended calling if they have any further questions.  Risk consent form and Pain Medication Agreement to be scanned into patient's chart.    Electronically signed by: Honora Seip, PA-C 09/25/2023 1:01 PM

## 2023-09-25 NOTE — Progress Notes (Signed)
 Patient ID: [name], female    DOB: [birth year], 63 y.o.   MRN: [id number]  Chief Complaint  Patient presents with   Pre-op Exam      ICD-10-CM   1. 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  female  with a history of forehead osteoma.  She presents for preoperative evaluation for upcoming procedure, excision of forehead osteoma, scheduled for 10/09/2023 with Dr. Lowery.  The patient has not had problems with anesthesia.  She is accompanied by her husband at bedside.  She has recently been seen by pulmonology for bronchiolitis, deemed not infective.  She will occasionally take albuterol  prophylactically prior to exercise with good effect.  She denies any personal or family history of blood clots or clotting disorder.  Denies any personal history of cancer, severe cardiac disease, MI/CVA, nicotine use, oral OCPs, or varicosities.  She takes the 81 mg ASA daily, but understands the need to hold 7 days prior to surgery.  May restart 2 days postoperatively.  Will send clearance to patient's primary care provider.  Discussed expectations regarding the osteoma excision and they are understanding and agreeable to proceed.  May consider Arnica to help with bruising postoperatively.  Emphasized the importance of compression, as well.  Of note, they would prefer local anesthesia with sedation rather than general.  Evidently this was fine per Dr. Lowery, but will still defer to anesthesia team.  Summary of Previous Visit: She was seen for consult Dr. Lowery on 09/12/2023.  At that time, reported 1 cm mass on the forehead that felt bony.  Deemed to likely osteoma.  Discussed surgical excision.  Patient understands that there will be a scar.  Agreeable to proceed.  Job: Retired Art therapist.  PMH Significant for: Forehead osteoma, GERD, asthma, bronchiolitis, pure hypercholesterolemia seen by [facility] heart care on statin and  ASA 81 mg.   Past Medical History: Allergies: Allergies  Allergen Reactions   Sulfa Antibiotics Other (See Comments)    Dyspnea and chest tightness   Other     [place] food (something in the spices) caused bronchospasms happens occasionally   Percocet [Oxycodone-Acetaminophen ] Hives and Itching    Tolerates acetaminophen     Current Medications:  Current Outpatient Medications:    albuterol  (VENTOLIN  HFA) 108 (90 Base) MCG/ACT inhaler, Ventolin  HFA 90 mcg/actuation aerosol inhaler  INHALE 1 PUFF EVERY 4 HOURS, Disp: , Rfl:    aspirin 81 MG tablet, Take 81 mg by mouth daily. Takes daily except when bleeding, Disp: , Rfl:    carboxymethylcellulose (REFRESH PLUS) 0.5 % SOLN, Apply to eye., Disp: , Rfl:    cephALEXin  (KEFLEX ) 500 MG capsule, Take 1 capsule (500 mg total) by mouth 4 (four) times daily for 3 days., Disp: 12 capsule, Rfl: 0   Cholecalciferol (VITAMIN D) 2000 UNITS CAPS, Take 2,000 Units by mouth daily. , Disp: , Rfl:    Coenzyme Q10 300 MG CAPS, Take 300 mg by mouth daily., Disp: , Rfl:    Estradiol (YUVAFEM) 10 MCG TABS vaginal tablet, Place 10 mcg vaginally 3 (three) times a week., Disp: , Rfl:    HYDROcodone -acetaminophen  (NORCO/VICODIN) 5-325 MG tablet, Take 1 tablet by mouth every 6 (six) hours as needed for up to 2 days for moderate pain (pain score 4-6)., Disp: 4 tablet, Rfl: 0   Lansoprazole (PREVACID PO), , Disp: , Rfl:    lansoprazole (PREVACID) 30 MG capsule,  Take 1 tablet by mouth daily., Disp: , Rfl:    LINZESS  290 MCG CAPS capsule, Take 290 mcg by mouth daily., Disp: , Rfl:    magnesium oxide (MAG-OX) 400 MG tablet, Take by mouth., Disp: , Rfl:    Omega-3 Fatty Acids (OMEGA-3 FISH OIL PO), Take 1 capsule by mouth daily., Disp: , Rfl:    Omeprazole-Sodium Bicarbonate (ZEGERID) 20-1100 MG CAPS capsule, Take 1 capsule by mouth at bedtime., Disp: , Rfl:    ondansetron  (ZOFRAN -ODT) 4 MG disintegrating tablet, Take 1 tablet (4 mg total) by mouth every 8 (eight) hours as  needed for nausea or vomiting., Disp: 20 tablet, Rfl: 0   Probiotic Product (ALIGN PO), Take 1 tablet by mouth daily. , Disp: , Rfl:    ranitidine (ZANTAC) 150 MG tablet, ranitidine 150 mg tablet, Disp: , Rfl:    riboflavin (VITAMIN B-2) 100 MG TABS tablet, Take 100 mg by mouth 2 (two) times daily., Disp: , Rfl:    rosuvastatin  (CRESTOR ) 10 MG tablet, rosuvastatin  10 mg tablet, Disp: , Rfl:   Past Medical Problems: Past Medical History:  Diagnosis Date   Arthritis    Asthma    asthmatic bronchitis   Dyspnea    on exertion   GERD (gastroesophageal reflux disease)    Heart murmur     Past Surgical History: Past Surgical History:  Procedure Laterality Date   ABDOMINAL HYSTERECTOMY     ANTERIOR CERVICAL DECOMP/DISCECTOMY FUSION N/A 06/25/2016   Procedure: CERVICAL THREE- CERVICAL FOUR, CERVICAL SIX- CERVICAL SEVEN, CERVICAL SEVEN- THORACIC ONE  ANTERIOR CERVICAL DECOMPRESSION/DISCECTOMY FUSION, REMOVAL OF ANTERIOR CERVICAL PLATE CERVICAL FOUR- CERVICAL SIX;  Surgeon: Victory Gens, MD;  Location: [facility] OR;  Service: Neurosurgery;  Laterality: N/A;   CERVICAL SPINE SURGERY     4-5 :5-6 cervical fusion sixteen yrs ago    Social History: Social History   Socioeconomic History   Marital status: Married    Spouse name: Not on file   Number of children: Not on file   Years of education: Not on file   Highest education level: Not on file  Occupational History   Not on file  Tobacco Use   Smoking status: Never   Smokeless tobacco: Never  Substance and Sexual Activity   Alcohol  use: Yes    Comment: Once a month or less   Drug use: Not on file   Sexual activity: Not on file  Other Topics Concern   Not on file  Social History Narrative   Not on file   Social Drivers of Health   Financial Resource Strain: Not on file  Food Insecurity: Not on file  Transportation Needs: Not on file  Physical Activity: Not on file  Stress: Not on file  Social Connections: Not on file  Intimate  Partner Violence: Not on file    Family History: Family History  Problem Relation Age of Onset   Breast cancer Mother        dx 10; dx 72; DCIS; two primaries; ER+   Hypertension Father    Lung cancer Maternal Uncle 65       smoking hx   Diabetes Maternal Grandmother    Brain cancer Maternal Grandmother 40       mets from other site? ?melanoma   Diabetes Paternal Grandfather    Pancreatic cancer Paternal Grandfather 53   Breast cancer Cousin 54       mat female cousin   Brain cancer Other        glioblastoma  in distant paternal cousin    Review of Systems: ROS Denies any recent chest pain, difficulty breathing, fevers, leg swelling.  Physical Exam: Vital Signs BP 103/65 (BP Location: Left Arm, Patient Position: Sitting, Cuff Size: Normal)   Pulse 84   Wt 100 lb (45.4 kg)   SpO2 96%   BMI 18.89 kg/m   Physical Exam Constitutional:      General: Not in acute distress.    Appearance: Normal appearance. Not ill-appearing.  HENT:     Head: Normocephalic and atraumatic.  Eyes:     Pupils: Pupils are equal, round. Cardiovascular:     Rate and Rhythm: Normal rate.    Pulses: Normal pulses.  Pulmonary:     Effort: No respiratory distress or increased work of breathing.  Speaks in full sentences. Abdominal:     General: Abdomen is flat. No distension.   Musculoskeletal: Normal range of motion. No lower extremity swelling or edema. No varicosities. Skin:    General: Skin is warm and dry.     Findings: No erythema or rash.  Neurological:     Mental Status: Alert and oriented to person, place, and time.  Psychiatric:        Mood and Affect: Mood normal.        Behavior: Behavior normal.    Assessment/Plan: The patient is scheduled for excision of forehead osteoma with Dr. Lowery.  Risks, benefits, and alternatives of procedure discussed, questions answered and consent obtained.    Smoking Status: Non-smoker.  Caprini Score: 4; Risk Factors include: Age and  length of planned surgery. Recommendation for mechanical prophylaxis. Encourage early ambulation.   Pictures obtained: 09/12/2023  Post-op Rx sent to pharmacy: Vicodin, Keflex , Zofran . Per patient and husband, hydrocodone  is OK, just not oxycodone.   Patient was provided with the General Surgical Risk consent document and Pain Medication Agreement prior to their appointment.  They had adequate time to read through the risk consent documents and Pain Medication Agreement. We also discussed them in person together during this preop appointment. All of their questions were answered to their satisfaction.  Recommended calling if they have any further questions.  Risk consent form and Pain Medication Agreement to be scanned into patient's chart.    Electronically signed by: Honora Seip, PA-C 09/25/2023 1:01 PM

## 2023-09-25 NOTE — Telephone Encounter (Signed)
 Faxed request for surgical clearance form to PCP with confirmed receipt.  Dr. Vernon Phone: [phone] Fax: [phone]

## 2023-09-26 NOTE — Telephone Encounter (Signed)
 Received surgical clearance request clearing pt for surgery.

## 2023-10-02 ENCOUNTER — Encounter (HOSPITAL_BASED_OUTPATIENT_CLINIC_OR_DEPARTMENT_OTHER): Payer: Self-pay | Admitting: Plastic Surgery

## 2023-10-09 ENCOUNTER — Encounter (HOSPITAL_BASED_OUTPATIENT_CLINIC_OR_DEPARTMENT_OTHER): Payer: Self-pay | Admitting: Plastic Surgery

## 2023-10-09 ENCOUNTER — Encounter (HOSPITAL_BASED_OUTPATIENT_CLINIC_OR_DEPARTMENT_OTHER)
Admission: RE | Disposition: A | Discharge status: Home / Self Care | Payer: Self-pay | Source: Home / Self Care | Attending: Plastic Surgery

## 2023-10-09 ENCOUNTER — Other Ambulatory Visit: Payer: Self-pay

## 2023-10-09 ENCOUNTER — Ambulatory Visit (HOSPITAL_BASED_OUTPATIENT_CLINIC_OR_DEPARTMENT_OTHER): Payer: Self-pay | Admitting: Anesthesiology

## 2023-10-09 ENCOUNTER — Ambulatory Visit (HOSPITAL_BASED_OUTPATIENT_CLINIC_OR_DEPARTMENT_OTHER)
Admission: RE | Admit: 2023-10-09 | Discharge: 2023-10-09 | Disposition: A | Discharge status: Home / Self Care | Attending: Plastic Surgery | Admitting: Plastic Surgery

## 2023-10-09 DIAGNOSIS — K219 Gastro-esophageal reflux disease without esophagitis: Secondary | ICD-10-CM | POA: Diagnosis not present

## 2023-10-09 DIAGNOSIS — D164 Benign neoplasm of bones of skull and face: Secondary | ICD-10-CM | POA: Diagnosis not present

## 2023-10-09 DIAGNOSIS — J45909 Unspecified asthma, uncomplicated: Secondary | ICD-10-CM | POA: Diagnosis not present

## 2023-10-09 DIAGNOSIS — M1990 Unspecified osteoarthritis, unspecified site: Secondary | ICD-10-CM | POA: Insufficient documentation

## 2023-10-09 DIAGNOSIS — Z01818 Encounter for other preprocedural examination: Secondary | ICD-10-CM

## 2023-10-09 HISTORY — PX: EXCISION MASS HEAD: SHX6702

## 2023-10-09 SURGERY — EXCISION, MASS, HEAD
Anesthesia: General | Site: Head

## 2023-10-09 MED ORDER — SODIUM CHLORIDE 0.9 % IV SOLN
250.0000 mL | INTRAVENOUS | Status: DC | PRN
Start: 2023-10-09 — End: 2023-10-09

## 2023-10-09 MED ORDER — SCOPOLAMINE 1 MG/3DAYS TD PT72
MEDICATED_PATCH | TRANSDERMAL | Status: AC
  Filled 2023-10-09: qty 1

## 2023-10-09 MED ORDER — FENTANYL CITRATE (PF) 100 MCG/2ML IJ SOLN
INTRAMUSCULAR | Status: DC | PRN
  Administered 2023-10-09: 25 ug via INTRAVENOUS

## 2023-10-09 MED ORDER — LIDOCAINE HCL (PF) 1 % IJ SOLN
INTRAMUSCULAR | Status: AC
  Filled 2023-10-09: qty 60

## 2023-10-09 MED ORDER — EPINEPHRINE PF 1 MG/ML IJ SOLN
INTRAMUSCULAR | Status: AC
  Filled 2023-10-09: qty 1

## 2023-10-09 MED ORDER — DROPERIDOL 2.5 MG/ML IJ SOLN: 0.6250 mg | Freq: Once | INTRAMUSCULAR | Status: DC | PRN

## 2023-10-09 MED ORDER — EPHEDRINE 5 MG/ML INJ
INTRAVENOUS | Status: AC
  Filled 2023-10-09: qty 5

## 2023-10-09 MED ORDER — 0.9 % SODIUM CHLORIDE (POUR BTL) OPTIME
TOPICAL | Status: DC | PRN
Start: 2023-10-09 — End: 2023-10-09
  Administered 2023-10-09: 1000 mL

## 2023-10-09 MED ORDER — PROPOFOL 10 MG/ML IV BOLUS
INTRAVENOUS | Status: AC
  Filled 2023-10-09: qty 20

## 2023-10-09 MED ORDER — DEXAMETHASONE SODIUM PHOSPHATE 10 MG/ML IJ SOLN
INTRAMUSCULAR | Status: DC | PRN
  Administered 2023-10-09: 5 mg via INTRAVENOUS

## 2023-10-09 MED ORDER — BUPIVACAINE HCL (PF) 0.25 % IJ SOLN
INTRAMUSCULAR | Status: AC
  Filled 2023-10-09: qty 150

## 2023-10-09 MED ORDER — PROPOFOL 10 MG/ML IV BOLUS
INTRAVENOUS | Status: DC | PRN
  Administered 2023-10-09: 100 mg via INTRAVENOUS
  Administered 2023-10-09: 150 ug/kg/min via INTRAVENOUS

## 2023-10-09 MED ORDER — MIDAZOLAM HCL 2 MG/2ML IJ SOLN
INTRAMUSCULAR | Status: AC
  Filled 2023-10-09: qty 2

## 2023-10-09 MED ORDER — BACITRACIN ZINC 500 UNIT/GM EX OINT
TOPICAL_OINTMENT | CUTANEOUS | Status: AC
  Filled 2023-10-09: qty 28.35

## 2023-10-09 MED ORDER — LIDOCAINE 2% (20 MG/ML) 5 ML SYRINGE
INTRAMUSCULAR | Status: DC | PRN
  Administered 2023-10-09: 40 mg via INTRAVENOUS

## 2023-10-09 MED ORDER — BUPIVACAINE-EPINEPHRINE 0.25% -1:200000 IJ SOLN
INTRAMUSCULAR | Status: DC | PRN
  Administered 2023-10-09: .5 mL

## 2023-10-09 MED ORDER — ONDANSETRON HCL 4 MG/2ML IJ SOLN
INTRAMUSCULAR | Status: DC | PRN
Start: 2023-10-09 — End: 2023-10-09
  Administered 2023-10-09: 4 mg via INTRAVENOUS

## 2023-10-09 MED ORDER — MIDAZOLAM HCL 2 MG/2ML IJ SOLN
INTRAMUSCULAR | Status: DC | PRN
Start: 2023-10-09 — End: 2023-10-09
  Administered 2023-10-09: 1 mg via INTRAVENOUS

## 2023-10-09 MED ORDER — SCOPOLAMINE 1 MG/3DAYS TD PT72
1.0000 | MEDICATED_PATCH | TRANSDERMAL | Status: DC
  Administered 2023-10-09: 1 via TRANSDERMAL

## 2023-10-09 MED ORDER — ONDANSETRON HCL 4 MG/2ML IJ SOLN
INTRAMUSCULAR | Status: AC
  Filled 2023-10-09: qty 2

## 2023-10-09 MED ORDER — SODIUM CHLORIDE 0.9% FLUSH: 3.0000 mL | Freq: Two times a day (BID) | INTRAVENOUS | Status: DC

## 2023-10-09 MED ORDER — CEFAZOLIN SODIUM-DEXTROSE 2-4 GM/100ML-% IV SOLN
2.0000 g | INTRAVENOUS | Status: AC
  Administered 2023-10-09: 2 g via INTRAVENOUS

## 2023-10-09 MED ORDER — LIDOCAINE-EPINEPHRINE 1 %-1:100000 IJ SOLN
INTRAMUSCULAR | Status: AC
  Filled 2023-10-09: qty 4

## 2023-10-09 MED ORDER — CEFAZOLIN SODIUM-DEXTROSE 2-4 GM/100ML-% IV SOLN
INTRAVENOUS | Status: AC
  Filled 2023-10-09: qty 100

## 2023-10-09 MED ORDER — CHLORHEXIDINE GLUCONATE CLOTH 2 % EX PADS: 6.0000 | MEDICATED_PAD | Freq: Once | CUTANEOUS | Status: DC

## 2023-10-09 MED ORDER — FENTANYL CITRATE (PF) 100 MCG/2ML IJ SOLN
INTRAMUSCULAR | Status: AC
Start: 2023-10-09 — End: 2023-10-09
  Filled 2023-10-09: qty 2

## 2023-10-09 MED ORDER — FENTANYL CITRATE (PF) 100 MCG/2ML IJ SOLN: 25.0000 ug | INTRAMUSCULAR | Status: DC | PRN

## 2023-10-09 MED ORDER — BUPIVACAINE-EPINEPHRINE (PF) 0.25% -1:200000 IJ SOLN
INTRAMUSCULAR | Status: AC
  Filled 2023-10-09: qty 30

## 2023-10-09 MED ORDER — SODIUM CHLORIDE 0.9% FLUSH
3.0000 mL | INTRAVENOUS | Status: DC | PRN
Start: 2023-10-09 — End: 2023-10-09

## 2023-10-09 MED ORDER — DEXAMETHASONE SODIUM PHOSPHATE 10 MG/ML IJ SOLN
INTRAMUSCULAR | Status: AC
  Filled 2023-10-09: qty 1

## 2023-10-09 MED ORDER — LACTATED RINGERS IV SOLN: INTRAVENOUS | Status: DC

## 2023-10-09 MED ORDER — EPHEDRINE SULFATE-NACL 50-0.9 MG/10ML-% IV SOSY
PREFILLED_SYRINGE | INTRAVENOUS | Status: DC | PRN
  Administered 2023-10-09: 10 mg via INTRAVENOUS

## 2023-10-09 MED ORDER — LIDOCAINE 2% (20 MG/ML) 5 ML SYRINGE
INTRAMUSCULAR | Status: AC
  Filled 2023-10-09: qty 5

## 2023-10-09 SURGICAL SUPPLY — 47 items
BLADE CLIPPER SURG (BLADE) IMPLANT
BLADE SURG 15 STRL LF DISP TIS (BLADE) ×1 IMPLANT
BNDG ELASTIC 2INX 5YD STR LF (GAUZE/BANDAGES/DRESSINGS) IMPLANT
CANISTER SUCT 1200ML W/VALVE (MISCELLANEOUS) IMPLANT
CORD BIPOLAR FORCEPS 12FT (ELECTRODE) IMPLANT
COVER BACK TABLE 60X90IN (DRAPES) ×1 IMPLANT
COVER MAYO STAND STRL (DRAPES) ×1 IMPLANT
DERMABOND ADVANCED .7 DNX12 (GAUZE/BANDAGES/DRESSINGS) IMPLANT
DRAPE LAPAROTOMY 100X72 PEDS (DRAPES) IMPLANT
DRAPE U-SHAPE 76X120 STRL (DRAPES) IMPLANT
DRSG TEGADERM 2-3/8X2-3/4 SM (GAUZE/BANDAGES/DRESSINGS) IMPLANT
ELECT NDL BLADE 2-5/6 (NEEDLE) ×1 IMPLANT
ELECT NEEDLE BLADE 2-5/6 (NEEDLE) ×1 IMPLANT
ELECTRODE REM PT RETRN 9FT PED (ELECTROSURGICAL) IMPLANT
ELECTRODE REM PT RTRN 9FT ADLT (ELECTROSURGICAL) IMPLANT
GAUZE SPONGE 2X2 STRL 8-PLY (GAUZE/BANDAGES/DRESSINGS) IMPLANT
GAUZE SPONGE 4X4 12PLY STRL LF (GAUZE/BANDAGES/DRESSINGS) IMPLANT
GAUZE STRETCH 2X75IN STRL (MISCELLANEOUS) IMPLANT
GAUZE XEROFORM 1X8 LF (GAUZE/BANDAGES/DRESSINGS) IMPLANT
GLOVE BIO SURGEON STRL SZ 6.5 (GLOVE) ×2 IMPLANT
GOWN STRL REUS W/ TWL LRG LVL3 (GOWN DISPOSABLE) ×2 IMPLANT
NDL HYPO 30GX1 BEV (NEEDLE) ×1 IMPLANT
NDL PRECISIONGLIDE 27X1.5 (NEEDLE) IMPLANT
NEEDLE HYPO 30GX1 BEV (NEEDLE) ×1 IMPLANT
NEEDLE PRECISIONGLIDE 27X1.5 (NEEDLE) IMPLANT
NS IRRIG 1000ML POUR BTL (IV SOLUTION) IMPLANT
PACK BASIN DAY SURGERY FS (CUSTOM PROCEDURE TRAY) ×1 IMPLANT
PENCIL SMOKE EVACUATOR (MISCELLANEOUS) IMPLANT
SHEET MEDIUM DRAPE 40X70 STRL (DRAPES) IMPLANT
SPIKE FLUID TRANSFER (MISCELLANEOUS) ×1 IMPLANT
STRIP CLOSURE SKIN 1/2X4 (GAUZE/BANDAGES/DRESSINGS) IMPLANT
SUCTION TUBE FRAZIER 10FR DISP (SUCTIONS) IMPLANT
SUT MNCRL 6-0 UNDY P1 1X18 (SUTURE) IMPLANT
SUT MNCRL AB 4-0 PS2 18 (SUTURE) IMPLANT
SUT MON AB 5-0 P3 18 (SUTURE) IMPLANT
SUT MON AB 5-0 PS2 18 (SUTURE) IMPLANT
SUT PROLENE 5 0 P 3 (SUTURE) IMPLANT
SUT PROLENE 5 0 PS 2 (SUTURE) IMPLANT
SUT PROLENE 6 0 P 1 18 (SUTURE) IMPLANT
SUT VIC AB 4-0 PS2 18 (SUTURE) IMPLANT
SUT VIC AB 5-0 P-3 18X BRD (SUTURE) IMPLANT
SUT VIC AB 5-0 PS2 18 (SUTURE) IMPLANT
SYR BULB EAR ULCER 3OZ GRN STR (SYRINGE) IMPLANT
SYR CONTROL 10ML LL (SYRINGE) ×1 IMPLANT
TOWEL GREEN STERILE FF (TOWEL DISPOSABLE) ×1 IMPLANT
TRAY DSU PREP LF (CUSTOM PROCEDURE TRAY) ×1 IMPLANT
TUBE CONNECTING 20X1/4 (TUBING) IMPLANT

## 2023-10-09 NOTE — Transfer of Care (Signed)
 Immediate Anesthesia Transfer of Care Note  Patient: Kimbely Whiteaker  Procedure(s) Performed: Procedure(s) (LRB): EXCISION, MASS, HEAD (N/A)  Patient Location: PACU  Anesthesia Type: General  Level of Consciousness: awake, oriented, sedated and patient cooperative  Airway & Oxygen Therapy: Patient Spontanous Breathing room air  Post-op Assessment: Report given to PACU RN and Post -op Vital signs reviewed and stable  Post vital signs: Reviewed and stable  Complications: No apparent anesthesia complications Last Vitals:  Vitals Value Taken Time  BP 86/52 10/09/23 08:00  Temp 36.1 C 10/09/23 07:57  Pulse 83 10/09/23 08:03  Resp 13 10/09/23 08:03  SpO2 95 % 10/09/23 08:03  Vitals shown include unfiled device data.  Last Pain:  Vitals:   10/09/23 0757  TempSrc:   PainSc: 0-No pain      Patients Stated Pain Goal: 3 (10/09/23 9365)  Complications: No notable events documented.

## 2023-10-09 NOTE — Anesthesia Procedure Notes (Signed)
 Procedure Name: LMA Insertion Date/Time: 10/09/2023 7:37 AM  Performed by: Delayne Olam BIRCH, CRNAPre-anesthesia Checklist: Patient identified, Emergency Drugs available, Suction available and Patient being monitored Patient Re-evaluated:Patient Re-evaluated prior to induction Oxygen Delivery Method: Circle system utilized Preoxygenation: Pre-oxygenation with 100% oxygen Induction Type: IV induction Ventilation: Mask ventilation without difficulty LMA: LMA inserted LMA Size: 3.0 Number of attempts: 1 Airway Equipment and Method: Bite block Placement Confirmation: positive ETCO2 Tube secured with: Tape Dental Injury: Teeth and Oropharynx as per pre-operative assessment

## 2023-10-09 NOTE — Op Note (Signed)
 DATE OF OPERATION: 10/09/2023  LOCATION: [facility]  PREOPERATIVE DIAGNOSIS: skull osteoma  POSTOPERATIVE DIAGNOSIS: Same  PROCEDURE: Excision of skull osteoma 1 cm  SURGEON: Estefana Fritter, DO  ASSISTANT: Matt Scheeler, PA  EBL: none  CONDITION: Stable  COMPLICATIONS: None  INDICATION: The patient, [name], is a 63 y.o. female born on 09-Aug-1960, is here for treatment of a skull mass.   PROCEDURE DETAILS:  The patient was seen prior to surgery and marked.  The IV antibiotics were given. The patient was taken to the operating room and given a general anesthetic. A standard time out was performed and all information was confirmed by those in the room. SCDs were placed.   The local was injected into the skin.  The #15 blade was used to make a small incision over the lesion.  The bovie was used to dissect to the lesion.  The mallet and chisel were used to free the lesion 1 cm from the bone. It came out very nicely.  The skin was closed with the 6-0 Monocryl.  A steri strip was applied.  The patient was allowed to wake up and taken to recovery room in stable condition at the end of the case. The family was notified at the end of the case.   The advanced practice practitioner (APP) assisted throughout the case.  The APP was essential in retraction and counter traction when needed to make the case progress smoothly.  This retraction and assistance made it possible to see the tissue plans for the procedure.  The assistance was needed for blood control, tissue re-approximation and assisted with closure of the incision site.

## 2023-10-09 NOTE — Interval H&P Note (Signed)
 History and Physical Interval Note:  10/09/2023 7:01 AM  [name]  has presented today for surgery, with the diagnosis of Osteoma of skull.  The various methods of treatment have been discussed with the patient and family. After consideration of risks, benefits and other options for treatment, the patient has consented to  Procedure(s): EXCISION, MASS, HEAD (N/A) as a surgical intervention.  The patient's history has been reviewed, patient examined, no change in status, stable for surgery.  I have reviewed the patient's chart and labs.  Questions were answered to the patient's satisfaction.     Estefana RAMAN Ailyne Pawley

## 2023-10-09 NOTE — Discharge Instructions (Addendum)
 INSTRUCTIONS FOR AFTER SURGERY   You will likely have some questions about what to expect following your operation.  The following information will help you and your family understand what to expect when you are discharged from the hospital.  It is important to follow these guidelines to help ensure a smooth recovery and reduce complication.  Postoperative instructions include information on: diet, wound care, medications and physical activity.  AFTER SURGERY Expect to go home after the procedure.  In some cases, you may need to spend one night in the hospital for observation.  DIET This surgery does not require a specific diet.  However, the healthier you eat the better your body will heal. It is important to increasing your protein intake.  This means limiting the foods with sugar and carbohydrates.  Focus on vegetables and some meat.  If you have liposuction during your procedure be sure to drink water.  If your urine is bright yellow, then it is concentrated, and you need to drink more water.  As a general rule after surgery, you should have 8 ounces of water every hour while awake.  If you find you are persistently nauseated or unable to take in liquids let us  know.  NO TOBACCO USE or EXPOSURE.  This will slow your healing process and lead to a wound.  WOUND CARE Use fragrance free soap like Dial, Dove or [place].   If you have steri-strips / tape directly attached to your skin leave them in place. It is OK to get these wet.   No baths, pools or hot tubs for one week. We close your incision to leave the smallest and best-looking scar. No ointment or creams on your incisions for four weeks.  No Neosporin (Too many skin reactions).  A few weeks after surgery you can use Mederma and start massaging the scar. Ice to the operative site for the first 24 hours is fine.  ACTIVITY No heavy lifting until cleared by the doctor.  This usually means no more than a half-gallon of milk.  It is OK to walk and  climb stairs. Moving your legs is very important to decrease your risk of a blood clot.  It will also help keep you from getting deconditioned.  Every 1 to 2 hours get up and walk for 5 minutes. This will help with a quicker recovery back to normal.  Let pain be your guide so you don't do too much.  This time is for you to recover.  You will be more comfortable if you sleep and rest with your head elevated either with a few pillows under you or in a recliner.  No stomach sleeping for a three months.  WORK Everyone returns to work at different times. As a rough guide, most people take at least 1 - 2 weeks off prior to returning to work. If you need documentation for your job, give the forms to the front staff at the clinic.  DRIVING Arrange for someone to bring you home from the hospital after your surgery.  You may be able to drive a few days after surgery but not while taking any narcotics or valium.  BOWEL MOVEMENTS Constipation can occur after anesthesia and while taking pain medication.  It is important to stay ahead for your comfort.  We recommend taking Milk of Magnesia (2 tablespoons; twice a day) while taking the pain pills.  MEDICATIONS You may be prescribed should start after surgery At your preoperative visit for you history and physical  you may have been given the following medications: An antibiotic: Start this medication when you get home and take according to the instructions on the bottle. Zofran  4 mg:  This is to treat nausea and vomiting.  You can take this every 6 hours as needed and only if needed. Norco (hydrocodone /acetaminophen ) 5/325 mg:  This is only to be used after you have taken the Motrin or the Tylenol . Every 8 hours as needed.  Over the counter Medication to take: Ibuprofen (Motrin) 600 mg:  Take this every 6 hours.  If you have additional pain then take 500 mg of the Tylenol  every 8 hours.  Only take the Norco after you have tried these two. MiraLAX  or Milk of  Magnesia: Take this according to the bottle if you take the Norco.  WHEN TO CALL Call your surgeon's office if any of the following occur: Fever 101 degrees F or greater Excessive bleeding or fluid from the incision site. Pain that increases over time without aid from the medications Redness, warmth, or pus draining from incision sites Persistent nausea or inability to take in liquids Severe misshapen area that underwent the operation.     Post Anesthesia Home Care Instructions  Activity: Get plenty of rest for the remainder of the day. A responsible individual must stay with you for 24 hours following the procedure.  For the next 24 hours, DO NOT: -Drive a car -Advertising copywriter -Drink alcoholic beverages -Take any medication unless instructed by your physician -Make any legal decisions or sign important papers.  Meals: Start with liquid foods such as gelatin or soup. Progress to regular foods as tolerated. Avoid greasy, spicy, heavy foods. If nausea and/or vomiting occur, drink only clear liquids until the nausea and/or vomiting subsides. Call your physician if vomiting continues.  Special Instructions/Symptoms: Your throat may feel dry or sore from the anesthesia or the breathing tube placed in your throat during surgery. If this causes discomfort, gargle with warm salt water. The discomfort should disappear within 24 hours.  If you had a scopolamine  patch placed behind your ear for the management of post- operative nausea and/or vomiting:  1. The medication in the patch is effective for 72 hours, after which it should be removed.  Wrap patch in a tissue and discard in the trash. Wash hands thoroughly with soap and water. 2. You may remove the patch earlier than 72 hours if you experience unpleasant side effects which may include dry mouth, dizziness or visual disturbances. 3. Avoid touching the patch. Wash your hands with soap and water after contact with the patch.

## 2023-10-09 NOTE — Anesthesia Postprocedure Evaluation (Signed)
 Anesthesia Post Note  Patient: [name]  Procedure(s) Performed: EXCISION, MASS, HEAD (Head)     Patient location during evaluation: PACU Anesthesia Type: General Level of consciousness: awake and alert Pain management: pain level controlled Vital Signs Assessment: post-procedure vital signs reviewed and stable Respiratory status: spontaneous breathing, nonlabored ventilation, respiratory function stable and patient connected to nasal cannula oxygen Cardiovascular status: blood pressure returned to baseline and stable Postop Assessment: no apparent nausea or vomiting Anesthetic complications: no   No notable events documented.  Last Vitals:  Vitals:   10/09/23 0830 10/09/23 0840  BP: (!) 87/66 (!) 101/56  Pulse: 74 68  Resp: 17 16  Temp:  (!) 36.4 C  SpO2: 96% 99%    Last Pain:  Vitals:   10/09/23 0840  TempSrc:   PainSc: 0-No pain                 [name]

## 2023-10-09 NOTE — Anesthesia Preprocedure Evaluation (Addendum)
 Anesthesia Evaluation  Patient identified by MRN, date of birth, ID band Patient awake    Reviewed: Allergy & Precautions, NPO status , Patient's Chart, lab work & pertinent test results  Airway Mallampati: I  TM Distance: >3 FB Neck ROM: Full    Dental  (+) Teeth Intact, Dental Advisory Given   Pulmonary asthma    breath sounds clear to auscultation       Cardiovascular negative cardio ROS  Rhythm:Regular Rate:Normal     Neuro/Psych  Neuromuscular disease  negative psych ROS   GI/Hepatic Neg liver ROS,GERD  Medicated,,  Endo/Other  negative endocrine ROS    Renal/GU negative Renal ROS     Musculoskeletal  (+) Arthritis ,    Abdominal   Peds  Hematology negative hematology ROS (+)   Anesthesia Other Findings   Reproductive/Obstetrics                              Anesthesia Physical Anesthesia Plan  ASA: 2  Anesthesia Plan: General   Post-op Pain Management: Tylenol  PO (pre-op)* and Toradol IV (intra-op)*   Induction: Intravenous  PONV Risk Score and Plan: 4 or greater and Ondansetron , Dexamethasone , Midazolam  and Scopolamine  patch - Pre-op  Airway Management Planned: LMA  Additional Equipment: None  Intra-op Plan:   Post-operative Plan: Extubation in OR  Informed Consent: I have reviewed the patients History and Physical, chart, labs and discussed the procedure including the risks, benefits and alternatives for the proposed anesthesia with the patient or authorized representative who has indicated his/her understanding and acceptance.     Dental advisory given  Plan Discussed with: CRNA  Anesthesia Plan Comments:          Anesthesia Quick Evaluation

## 2023-10-10 ENCOUNTER — Encounter (HOSPITAL_BASED_OUTPATIENT_CLINIC_OR_DEPARTMENT_OTHER): Payer: Self-pay | Admitting: Plastic Surgery

## 2023-10-14 LAB — SURGICAL PATHOLOGY

## 2023-10-16 NOTE — Progress Notes (Unsigned)
 Patient is a pleasant 63 year old female s/p osteoma excision performed 10/09/2023 by Dr. Lowery who presents to clinic for postoperative follow-up.  Specimen was sent to pathology and consistent with osteoma.  Skin edges were closed with 6-0 Monocryl followed by Steri-Strip.  Today,

## 2023-10-17 ENCOUNTER — Ambulatory Visit (INDEPENDENT_AMBULATORY_CARE_PROVIDER_SITE_OTHER): Admitting: Physician Assistant

## 2023-10-17 DIAGNOSIS — Z719 Counseling, unspecified: Secondary | ICD-10-CM

## 2023-10-17 DIAGNOSIS — D164 Benign neoplasm of bones of skull and face: Secondary | ICD-10-CM

## 2023-10-30 ENCOUNTER — Encounter: Admitting: Physician Assistant

## 2023-11-14 ENCOUNTER — Encounter: Payer: Self-pay | Admitting: Plastic Surgery

## 2023-11-14 ENCOUNTER — Ambulatory Visit: Admitting: Plastic Surgery

## 2023-11-14 VITALS — BP 106/74 | HR 77 | Ht 61.5 in | Wt 105.0 lb

## 2023-11-14 DIAGNOSIS — H02834 Dermatochalasis of left upper eyelid: Secondary | ICD-10-CM

## 2023-11-14 DIAGNOSIS — D164 Benign neoplasm of bones of skull and face: Secondary | ICD-10-CM

## 2023-11-14 DIAGNOSIS — H02839 Dermatochalasis of unspecified eye, unspecified eyelid: Secondary | ICD-10-CM | POA: Insufficient documentation

## 2023-11-14 DIAGNOSIS — H02831 Dermatochalasis of right upper eyelid: Secondary | ICD-10-CM

## 2023-11-14 NOTE — Progress Notes (Signed)
   Subjective:    Patient ID: [name], female    DOB: 06/05/1960, 63 y.o.   MRN: [id number]  The patient is a 63 year old female here for follow-up after having an osteoma excised from the forehead.  The area is healing really nicely.  She is having a little bit of occasional tightness but not so much pain where she feels like a ponytail is too tight.  This is likely part of the healing process and not anything we would do any changes to.  If it really bothers her we could do a little local in the area to break the cycle but I would like her to do some massage and give it some more time.  She also complains of hooding of her upper eyelids.  This is quite noticeable.  She said that this runs in the family and she has noticed it for several years.  Tilting of the head sometimes makes vision of the TV a little bit better but sometimes it is hard to tell.      Review of Systems  Constitutional: Negative.   Eyes: Negative.   Respiratory: Negative.    Cardiovascular: Negative.   Gastrointestinal: Negative.   Endocrine: Negative.   Genitourinary: Negative.   Musculoskeletal: Negative.        Objective:   Physical Exam Constitutional:      Appearance: Normal appearance.  HENT:     Head: Atraumatic.  Skin:    General: Skin is warm.     Capillary Refill: Capillary refill takes less than 2 seconds.  Neurological:     Mental Status: She is oriented to person, place, and time.  Psychiatric:        Mood and Affect: Mood normal.        Behavior: Behavior normal.        Thought Content: Thought content normal.        Judgment: Judgment normal.         Assessment & Plan:     ICD-10-CM   1. 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 Dermatochalasis of both upper eyelids  H02.831    H02.834       We are going to go ahead with a visual field exam and evaluate the function of the upper lids.  She is a good candidate for an upper lid blepharoplasty.  Pictures were obtained of the  patient and placed in the chart with the patient's or guardian's permission.

## 2023-11-14 NOTE — Addendum Note (Signed)
 Addended by: DEVONDA ISAIAH SAILOR on: 11/14/2023 09:27 AM   Modules accepted: Orders

## 2024-01-09 ENCOUNTER — Ambulatory Visit: Admitting: Plastic Surgery

## 2024-01-09 DIAGNOSIS — H02831 Dermatochalasis of right upper eyelid: Secondary | ICD-10-CM

## 2024-01-09 DIAGNOSIS — D164 Benign neoplasm of bones of skull and face: Secondary | ICD-10-CM

## 2024-01-09 DIAGNOSIS — H02834 Dermatochalasis of left upper eyelid: Secondary | ICD-10-CM

## 2024-01-09 NOTE — Progress Notes (Signed)
   Subjective:    Patient ID: [name], female    DOB: 03/12/1960, 63 y.o.   MRN: [id number]  The patient is a 63 year old female joining me by phone.  As far as I know she is at home and I am at the office.  She did have the visual field exam.  I do not have those results yet.  The patient said that Dr. Patrcia did not think she would qualify for insurance for upper lid blepharoplasty.      Review of Systems     Objective:   Physical Exam      Assessment & Plan:     ICD-10-CM   1. Dermatochalasis of both upper eyelids  H02.831    H02.834     2. 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  We spent less than 5 minutes in discussion.   I discussed the limitations of evaluation and management by telemedicine. The patient expressed understanding and agreed to proceed.  Patient would like to wait and not pursue surgery at this time.  She knows to call if she changes her mind.

## 2024-03-16 ENCOUNTER — Other Ambulatory Visit: Payer: Self-pay | Admitting: Obstetrics and Gynecology

## 2024-03-16 DIAGNOSIS — Z1231 Encounter for screening mammogram for malignant neoplasm of breast: Secondary | ICD-10-CM

## 2024-05-05 ENCOUNTER — Ambulatory Visit

## 2024-05-31 ENCOUNTER — Ambulatory Visit: Admitting: Cardiovascular Disease
